# Patient Record
Sex: Female | Born: 1965 | Race: Black or African American | Hispanic: No | Marital: Married | Smoking: Never smoker
Health system: Southern US, Community
[De-identification: ages and names within clinical notes are randomized; demographics above are authoritative.]

## PROBLEM LIST (undated history)

## (undated) DIAGNOSIS — M1990 Unspecified osteoarthritis, unspecified site: Secondary | ICD-10-CM

## (undated) DIAGNOSIS — G629 Polyneuropathy, unspecified: Secondary | ICD-10-CM

## (undated) DIAGNOSIS — E7800 Pure hypercholesterolemia, unspecified: Secondary | ICD-10-CM

## (undated) DIAGNOSIS — E119 Type 2 diabetes mellitus without complications: Secondary | ICD-10-CM

## (undated) DIAGNOSIS — M869 Osteomyelitis, unspecified: Secondary | ICD-10-CM

## (undated) DIAGNOSIS — N289 Disorder of kidney and ureter, unspecified: Secondary | ICD-10-CM

## (undated) DIAGNOSIS — C801 Malignant (primary) neoplasm, unspecified: Secondary | ICD-10-CM

## (undated) DIAGNOSIS — I10 Essential (primary) hypertension: Secondary | ICD-10-CM

## (undated) DIAGNOSIS — M329 Systemic lupus erythematosus, unspecified: Secondary | ICD-10-CM

## (undated) HISTORY — PX: ABDOMINAL HYSTERECTOMY: SHX81

## (undated) HISTORY — PX: AMPUTATION TOE: SHX6595

## (undated) HISTORY — PX: LAPAROSCOPIC GASTRIC SLEEVE RESECTION: SHX5895

---

## 2017-08-21 ENCOUNTER — Other Ambulatory Visit: Payer: Self-pay

## 2017-08-21 ENCOUNTER — Emergency Department (HOSPITAL_BASED_OUTPATIENT_CLINIC_OR_DEPARTMENT_OTHER): Payer: Medicaid Other

## 2017-08-21 ENCOUNTER — Encounter (HOSPITAL_BASED_OUTPATIENT_CLINIC_OR_DEPARTMENT_OTHER): Payer: Self-pay

## 2017-08-21 DIAGNOSIS — M79604 Pain in right leg: Secondary | ICD-10-CM | POA: Insufficient documentation

## 2017-08-21 DIAGNOSIS — I129 Hypertensive chronic kidney disease with stage 1 through stage 4 chronic kidney disease, or unspecified chronic kidney disease: Secondary | ICD-10-CM | POA: Diagnosis not present

## 2017-08-21 DIAGNOSIS — M79605 Pain in left leg: Secondary | ICD-10-CM | POA: Insufficient documentation

## 2017-08-21 DIAGNOSIS — N183 Chronic kidney disease, stage 3 (moderate): Secondary | ICD-10-CM | POA: Diagnosis not present

## 2017-08-21 DIAGNOSIS — Z79899 Other long term (current) drug therapy: Secondary | ICD-10-CM | POA: Diagnosis not present

## 2017-08-21 DIAGNOSIS — E1122 Type 2 diabetes mellitus with diabetic chronic kidney disease: Secondary | ICD-10-CM | POA: Insufficient documentation

## 2017-08-21 DIAGNOSIS — Z859 Personal history of malignant neoplasm, unspecified: Secondary | ICD-10-CM | POA: Diagnosis not present

## 2017-08-21 DIAGNOSIS — Z794 Long term (current) use of insulin: Secondary | ICD-10-CM | POA: Diagnosis not present

## 2017-08-21 DIAGNOSIS — W010XXA Fall on same level from slipping, tripping and stumbling without subsequent striking against object, initial encounter: Secondary | ICD-10-CM | POA: Insufficient documentation

## 2017-08-21 NOTE — ED Triage Notes (Signed)
Pt reports she slipped and fell last PM. Pt c/o pain today in lower back, L hip, L knee, and bilateral ankles. Pt states she hit her head on the ground but denies and LOC.

## 2017-08-22 ENCOUNTER — Emergency Department (HOSPITAL_BASED_OUTPATIENT_CLINIC_OR_DEPARTMENT_OTHER)
Admission: EM | Admit: 2017-08-22 | Discharge: 2017-08-22 | Discharge status: Against Medical Advice | Payer: Medicaid Other | Attending: Emergency Medicine | Admitting: Emergency Medicine

## 2017-08-22 DIAGNOSIS — M79604 Pain in right leg: Secondary | ICD-10-CM

## 2017-08-22 DIAGNOSIS — M79605 Pain in left leg: Secondary | ICD-10-CM

## 2017-08-22 DIAGNOSIS — W19XXXA Unspecified fall, initial encounter: Secondary | ICD-10-CM

## 2017-08-22 HISTORY — DX: Osteomyelitis, unspecified: M86.9

## 2017-08-22 HISTORY — DX: Unspecified osteoarthritis, unspecified site: M19.90

## 2017-08-22 HISTORY — DX: Essential (primary) hypertension: I10

## 2017-08-22 HISTORY — DX: Pure hypercholesterolemia, unspecified: E78.00

## 2017-08-22 HISTORY — DX: Malignant (primary) neoplasm, unspecified: C80.1

## 2017-08-22 HISTORY — DX: Polyneuropathy, unspecified: G62.9

## 2017-08-22 HISTORY — DX: Disorder of kidney and ureter, unspecified: N28.9

## 2017-08-22 HISTORY — DX: Systemic lupus erythematosus, unspecified: M32.9

## 2017-08-22 HISTORY — DX: Type 2 diabetes mellitus without complications: E11.9

## 2017-08-22 MED ORDER — KETOROLAC TROMETHAMINE 15 MG/ML IJ SOLN
15.0000 mg | Freq: Once | INTRAMUSCULAR | Status: AC
  Administered 2017-08-22: 15 mg via INTRAMUSCULAR
  Filled 2017-08-22: qty 1

## 2017-08-22 MED ORDER — KETOROLAC TROMETHAMINE 15 MG/ML IJ SOLN: 15.0000 mg | Freq: Once | INTRAMUSCULAR | Status: DC

## 2017-08-22 MED ORDER — ACETAMINOPHEN 500 MG PO TABS
1000.0000 mg | ORAL_TABLET | Freq: Once | ORAL | Status: AC
Start: 2017-08-22 — End: 2017-08-22
  Administered 2017-08-22: 1000 mg via ORAL
  Filled 2017-08-22: qty 2

## 2017-08-22 MED ORDER — DIAZEPAM 2 MG PO TABS
2.0000 mg | ORAL_TABLET | Freq: Once | ORAL | Status: AC
  Administered 2017-08-22: 2 mg via ORAL
  Filled 2017-08-22: qty 1

## 2017-08-22 NOTE — ED Notes (Signed)
Pt family to the registration desk to notify staff they are still here in canteen area.

## 2017-08-22 NOTE — ED Provider Notes (Signed)
[facility] EMERGENCY DEPARTMENT Provider Note   CSN: [id number] Arrival date & time: 08/21/17  2134     History   Chief Complaint Chief Complaint  Patient presents with  . Fall    HPI Courtney Salazar is a 52 y.o. female.  42 yoF with a chief complaint of a fall from standing.  The patient slipped on a wet floor while she was at a restaurant.  She landed with her legs in a split.  Complaining of pain throughout bilateral lower extremities.  She is unable to localize any area of worst pain.  She told me that she essentially hurts all over since she fell.  She has been ambulatory.  Denies chest pain abdominal pain head injury or loss consciousness.  Denies back pain.   The history is provided by the patient.  Fall  This is a new problem. The current episode started 6 to 12 hours ago. The problem occurs rarely. The problem has been resolved. Pertinent negatives include no chest pain, no headaches and no shortness of breath. The symptoms are aggravated by walking, bending and twisting. Nothing relieves the symptoms. She has tried nothing for the symptoms. The treatment provided no relief.    Past Medical History:  Diagnosis Date  . Arthritis   . Cancer ([place])    unknown  . Diabetes mellitus without complication ([place])   . High cholesterol   . Hypertension   . Lupus   . Neuropathy   . Osteomyelitis ([place])   . Renal disorder    CKD Stage 3    There are no active problems to display for this patient.   Past Surgical History:  Procedure Laterality Date  . ABDOMINAL HYSTERECTOMY    . AMPUTATION TOE Right    5th  . LAPAROSCOPIC GASTRIC SLEEVE RESECTION      OB History    No data available       Home Medications    Prior to Admission medications   Medication Sig Start Date End Date Taking? Authorizing Provider  amLODipine (NORVASC) 5 MG tablet Take 5 mg by mouth daily.   Yes [provider]  atorvastatin (LIPITOR) 40 MG tablet Take 40 mg by mouth  daily.   Yes [provider]  calcium carbonate 1250 MG capsule Take 1,250 mg by mouth 2 (two) times daily with a meal.   Yes [provider]  diclofenac sodium (VOLTAREN) 1 % GEL Apply topically 4 (four) times daily.   Yes [provider]  ergocalciferol (VITAMIN D2) 50000 units capsule Take 50,000 Units by mouth once a week.   Yes [provider]  gabapentin (NEURONTIN) 300 MG capsule Take 300 mg by mouth 3 (three) times daily.   Yes [provider]  insulin detemir (LEVEMIR) 100 UNIT/ML injection Inject into the skin at bedtime.   Yes [provider]  losartan-hydrochlorothiazide (HYZAAR) 100-25 MG tablet Take 1 tablet by mouth daily.   Yes [provider]  metFORMIN (GLUCOPHAGE) 500 MG tablet Take by mouth 2 (two) times daily with a meal.   Yes [provider]  omeprazole (PRILOSEC) 20 MG capsule Take 20 mg by mouth daily.   Yes [provider]  pregabalin (LYRICA) 100 MG capsule Take 100 mg by mouth 2 (two) times daily.   Yes [provider]  promethazine (PHENERGAN) 25 MG tablet Take 25 mg by mouth every 6 (six) hours as needed for nausea or vomiting.   Yes [provider]  venlafaxine [facility])  37.5 MG tablet Take 37.5 mg by mouth 2 (two) times daily.   Yes [provider]    Family History No family history on file.  Social History Social History   Tobacco Use  . Smoking status: Never Smoker  . Smokeless tobacco: Never Used  Substance Use Topics  . Alcohol use: No    Frequency: Never  . Drug use: No     Allergies   Morphine and related   Review of Systems Review of Systems  Constitutional: Negative for chills and fever.  HENT: Negative for congestion and rhinorrhea.   Eyes: Negative for redness and visual disturbance.  Respiratory: Negative for shortness of breath and wheezing.   Cardiovascular: Negative for chest pain and palpitations.  Gastrointestinal:  Negative for nausea and vomiting.  Genitourinary: Negative for dysuria and urgency.  Musculoskeletal: Positive for arthralgias and gait problem. Negative for myalgias.  Skin: Negative for pallor and wound.  Neurological: Negative for dizziness and headaches.     Physical Exam Updated Vital Signs BP (!) 155/70 (BP Location: Right Arm)   Pulse 84   Temp 98.2 F (36.8 C) (Oral)   Resp 18   Ht 5\' 6"  (1.676 m)   Wt 92.1 kg (203 lb)   SpO2 98%   BMI 32.77 kg/m   Physical Exam  Constitutional: She is oriented to person, place, and time. She appears well-developed and well-nourished. No distress.  HENT:  Head: Normocephalic and atraumatic.  Eyes: EOM are normal. Pupils are equal, round, and reactive to light.  Neck: Normal range of motion. Neck supple.  Cardiovascular: Normal rate and regular rhythm. Exam reveals no gallop and no friction rub.  No murmur heard. Pulmonary/Chest: Effort normal. She has no wheezes. She has no rales.  Abdominal: Soft. She exhibits no distension. There is no tenderness.  Musculoskeletal: She exhibits no edema or tenderness.  The patient complains of severe pain no matter where I touch her to bilateral lower extremities.  She states that the worst is to the left greater trochanter.  Pulse and motor intact to the left lower extremity.  She denies ability to feel with her feet due to diabetes.  Neurological: She is alert and oriented to person, place, and time.  Skin: Skin is warm and dry. She is not diaphoretic.  Psychiatric: She has a normal mood and affect. Her behavior is normal.  Nursing note and vitals reviewed.    ED Treatments / Results  Labs (all labs ordered are listed, but only abnormal results are displayed) Labs Reviewed - No data to display  EKG  EKG Interpretation None       Radiology Dg Ankle Complete Left  Result Date: 08/21/2017 CLINICAL DATA:  Pain after fall last evening. EXAM: LEFT ANKLE COMPLETE - 3+ VIEW COMPARISON:   None. FINDINGS: There is no evidence of fracture, dislocation, or joint effusion. Periosteal new bone formation and calcification of the distal interosseous membrane likely reflects stigmata from old remote trauma. Calcaneal enthesopathy is noted along the plantar aspect. Soft tissues are unremarkable. IMPRESSION: 1. Stigmata of old remote trauma along the expected location of the interosseous membrane. 2. No acute fracture, malalignment or joint effusion. 3. Plantar calcaneal enthesophyte. Electronically Signed   By: Ashley Royalty M.D.   On: 08/21/2017 22:41   Dg Ankle Complete Right  Result Date: 08/21/2017 CLINICAL DATA:  Pain after fall last evening EXAM: RIGHT ANKLE - COMPLETE 3+ VIEW COMPARISON:  None. FINDINGS: Small subchondral rounded lucency with sclerotic margins may reflect  an osteochondral defect/osteochondritis dissecans of the lateral talar dome versus degenerative subchondral cyst. No joint effusion, acute fracture malalignment is identified. Mild soft tissue swelling about the malleoli is noted. Plantar calcaneal enthesophyte is seen. IMPRESSION: 1. No acute fracture.  Mild soft tissue swelling about the malleoli. 2. 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 Electronically Signed   By: Ashley Royalty M.D.   On: 08/21/2017 22:43   Dg Knee Complete 4 Views Left  Result Date: 08/21/2017 CLINICAL DATA:  Lateral knee pain after fall last evening. Pain with weight-bearing. EXAM: LEFT KNEE - COMPLETE 4+ VIEW COMPARISON:  None. FINDINGS: No acute fracture or malalignment of the left knee. Tiny suprapatellar joint effusion. No intra-articular loose body. No significant soft tissue swelling. IMPRESSION: No acute osseous abnormality of the left knee.  Tiny joint effusion. Electronically Signed   By: Ashley Royalty M.D.   On: 08/21/2017 22:39   Dg Hip Unilat W Or Wo Pelvis 2-3 Views Left  Result Date: 08/21/2017 CLINICAL DATA:  Left hip pain after  fall last evening. EXAM: DG HIP (WITH OR WITHOUT PELVIS) 2-3V LEFT COMPARISON:  None. FINDINGS: There is no evidence of hip fracture or dislocation. Tiny left os acetabulum off the acetabular roof. There is no evidence of arthropathy or other focal bone abnormality. IMPRESSION: No acute osseous abnormality. Electronically Signed   By: Ashley Royalty M.D.   On: 08/21/2017 22:45    Procedures Procedures (including critical care time)  Medications Ordered in ED Medications  acetaminophen (TYLENOL) tablet 1,000 mg (1,000 mg Oral Given 08/22/17 0236)  diazepam (VALIUM) tablet 2 mg (2 mg Oral Given 08/22/17 0235)  ketorolac (TORADOL) 15 MG/ML injection 15 mg (15 mg Intramuscular Given 08/22/17 0236)     Initial Impression / Assessment and Plan / ED Course  I have reviewed the triage vital signs and the nursing notes.  Pertinent labs & imaging results that were available during my care of the patient were reviewed by me and considered in my medical decision making (see chart for details).     52 yo F with a chief complaint of a fall from standing.  The patient has no signs of trauma but is planing of severe tenderness on exam to everywhere that I touch.  Imaging is negative for fracture.  Discharge home.  2:39 AM:  I have discussed the diagnosis/risks/treatment options with the patient and family and believe the pt to be eligible for discharge home to follow-up with PCP. We also discussed returning to the ED immediately if new or worsening sx occur. We discussed the sx which are most concerning (e.g., sudden worsening pain, fever, inability to tolerate by mouth) that necessitate immediate return. Medications administered to the patient during their visit and any new prescriptions provided to the patient are listed below.  Medications given during this visit Medications  acetaminophen (TYLENOL) tablet 1,000 mg (1,000 mg Oral Given 08/22/17 0236)  diazepam (VALIUM) tablet 2 mg (2 mg Oral Given 08/22/17  0235)  ketorolac (TORADOL) 15 MG/ML injection 15 mg (15 mg Intramuscular Given 08/22/17 0236)     The patient appears reasonably screen and/or stabilized for discharge and I doubt any other medical condition or other [facility] requiring further screening, evaluation, or treatment in the ED at this time prior to discharge.    Final Clinical Impressions(s) / ED Diagnoses   Final diagnoses:  Fall, initial encounter  Pain in both lower extremities    ED Discharge Orders  None       Deno Etienne, DO 08/22/17 [phone]

## 2017-08-22 NOTE — Discharge Instructions (Signed)
Take 4 over the counter ibuprofen tablets 3 times a day or 2 over-the-counter naproxen tablets twice a day for pain. Also take tylenol 1000mg(2 extra strength) four times a day.    

## 2017-08-22 NOTE — ED Notes (Signed)
Pt called x3 with no response. Disposition set to [facility].

## 2018-12-04 ENCOUNTER — Emergency Department (HOSPITAL_COMMUNITY): Payer: Medicaid Other

## 2018-12-04 ENCOUNTER — Other Ambulatory Visit: Payer: Self-pay

## 2018-12-04 ENCOUNTER — Emergency Department (HOSPITAL_COMMUNITY)
Admission: EM | Admit: 2018-12-04 | Discharge: 2018-12-04 | Disposition: A | Discharge status: Home / Self Care | Payer: Medicaid Other | Attending: Emergency Medicine | Admitting: Emergency Medicine

## 2018-12-04 ENCOUNTER — Encounter (HOSPITAL_COMMUNITY): Payer: Self-pay

## 2018-12-04 DIAGNOSIS — Z79899 Other long term (current) drug therapy: Secondary | ICD-10-CM | POA: Insufficient documentation

## 2018-12-04 DIAGNOSIS — N183 Chronic kidney disease, stage 3 (moderate): Secondary | ICD-10-CM | POA: Insufficient documentation

## 2018-12-04 DIAGNOSIS — R531 Weakness: Secondary | ICD-10-CM | POA: Diagnosis not present

## 2018-12-04 DIAGNOSIS — I129 Hypertensive chronic kidney disease with stage 1 through stage 4 chronic kidney disease, or unspecified chronic kidney disease: Secondary | ICD-10-CM | POA: Insufficient documentation

## 2018-12-04 DIAGNOSIS — R519 Headache, unspecified: Secondary | ICD-10-CM

## 2018-12-04 DIAGNOSIS — Z794 Long term (current) use of insulin: Secondary | ICD-10-CM | POA: Diagnosis not present

## 2018-12-04 DIAGNOSIS — R51 Headache: Secondary | ICD-10-CM | POA: Diagnosis present

## 2018-12-04 DIAGNOSIS — E1122 Type 2 diabetes mellitus with diabetic chronic kidney disease: Secondary | ICD-10-CM | POA: Insufficient documentation

## 2018-12-04 DIAGNOSIS — E1140 Type 2 diabetes mellitus with diabetic neuropathy, unspecified: Secondary | ICD-10-CM | POA: Insufficient documentation

## 2018-12-04 LAB — COMPREHENSIVE METABOLIC PANEL
ALT: 19 U/L (ref 0–44)
AST: 28 U/L (ref 15–41)
Albumin: 3.3 g/dL — ABNORMAL LOW (ref 3.5–5.0)
Alkaline Phosphatase: 79 U/L (ref 38–126)
Anion gap: 9 (ref 5–15)
BUN: 24 mg/dL — ABNORMAL HIGH (ref 6–20)
CO2: 19 mmol/L — ABNORMAL LOW (ref 22–32)
Calcium: 9.3 mg/dL (ref 8.9–10.3)
Chloride: 107 mmol/L (ref 98–111)
Creatinine, Ser: 1.42 mg/dL — ABNORMAL HIGH (ref 0.44–1.00)
GFR calc Af Amer: 48 mL/min — ABNORMAL LOW (ref >60)
GFR calc non Af Amer: 42 mL/min — ABNORMAL LOW (ref >60)
Glucose, Bld: 379 mg/dL — ABNORMAL HIGH (ref 70–99)
Potassium: 4.3 mmol/L (ref 3.5–5.1)
Sodium: 135 mmol/L (ref 135–145)
Total Bilirubin: 0.6 mg/dL (ref 0.3–1.2)
Total Protein: 7.1 g/dL (ref 6.5–8.1)

## 2018-12-04 LAB — I-STAT CHEM 8, ED
BUN: 25 mg/dL — ABNORMAL HIGH (ref 6–20)
Calcium, Ion: 1.28 mmol/L (ref 1.15–1.40)
Chloride: 110 mmol/L (ref 98–111)
Creatinine, Ser: 1.3 mg/dL — ABNORMAL HIGH (ref 0.44–1.00)
Glucose, Bld: 378 mg/dL — ABNORMAL HIGH (ref 70–99)
HCT: 35 % — ABNORMAL LOW (ref 36.0–46.0)
Hemoglobin: 11.9 g/dL — ABNORMAL LOW (ref 12.0–15.0)
Potassium: 4.3 mmol/L (ref 3.5–5.1)
Sodium: 139 mmol/L (ref 135–145)
TCO2: 22 mmol/L (ref 22–32)

## 2018-12-04 LAB — DIFFERENTIAL
Abs Immature Granulocytes: 0.01 10*3/uL (ref 0.00–0.07)
Basophils Absolute: 0 10*3/uL (ref 0.0–0.1)
Basophils Relative: 1 %
Eosinophils Absolute: 0.2 10*3/uL (ref 0.0–0.5)
Eosinophils Relative: 2 %
Immature Granulocytes: 0 %
Lymphocytes Relative: 44 %
Lymphs Abs: 3.2 10*3/uL (ref 0.7–4.0)
Monocytes Absolute: 0.5 10*3/uL (ref 0.1–1.0)
Monocytes Relative: 8 %
Neutro Abs: 3.1 10*3/uL (ref 1.7–7.7)
Neutrophils Relative %: 45 %

## 2018-12-04 LAB — I-STAT BETA HCG BLOOD, ED (MC, WL, AP ONLY): I-stat hCG, quantitative: <5 m[IU]/mL (ref <5)

## 2018-12-04 LAB — CBC
HCT: 34.7 % — ABNORMAL LOW (ref 36.0–46.0)
Hemoglobin: 10.7 g/dL — ABNORMAL LOW (ref 12.0–15.0)
MCH: 27.9 pg (ref 26.0–34.0)
MCHC: 30.8 g/dL (ref 30.0–36.0)
MCV: 90.6 fL (ref 80.0–100.0)
Platelets: 177 10*3/uL (ref 150–400)
RBC: 3.83 MIL/uL — ABNORMAL LOW (ref 3.87–5.11)
RDW: 13.6 % (ref 11.5–15.5)
WBC: 7 10*3/uL (ref 4.0–10.5)
nRBC: 0 % (ref 0.0–0.2)

## 2018-12-04 LAB — PROTIME-INR
INR: 0.9 (ref 0.8–1.2)
Prothrombin Time: 12.5 seconds (ref 11.4–15.2)

## 2018-12-04 LAB — APTT: aPTT: 40 seconds — ABNORMAL HIGH (ref 24–36)

## 2018-12-04 MED ORDER — SODIUM CHLORIDE 0.9% FLUSH
3.0000 mL | Freq: Once | INTRAVENOUS | Status: AC
  Administered 2018-12-04: 3 mL via INTRAVENOUS

## 2018-12-04 MED ORDER — MAGNESIUM SULFATE 2 GM/50ML IV SOLN
2.0000 g | Freq: Once | INTRAVENOUS | Status: AC
  Administered 2018-12-04: 2 g via INTRAVENOUS
  Filled 2018-12-04: qty 50

## 2018-12-04 MED ORDER — DIPHENHYDRAMINE HCL 50 MG/ML IJ SOLN
12.5000 mg | Freq: Once | INTRAMUSCULAR | Status: AC
  Administered 2018-12-04: 12.5 mg via INTRAVENOUS
  Filled 2018-12-04: qty 1

## 2018-12-04 MED ORDER — SODIUM CHLORIDE 0.9 % IV BOLUS
1000.0000 mL | Freq: Once | INTRAVENOUS | Status: AC
  Administered 2018-12-04: 1000 mL via INTRAVENOUS

## 2018-12-04 MED ORDER — DIVALPROEX SODIUM 250 MG PO DR TAB
500.0000 mg | DELAYED_RELEASE_TABLET | Freq: Once | ORAL | Status: AC
  Administered 2018-12-04: 04:00:00 500 mg via ORAL
  Filled 2018-12-04: qty 2

## 2018-12-04 MED ORDER — IOHEXOL 350 MG/ML SOLN
75.0000 mL | Freq: Once | INTRAVENOUS | Status: AC | PRN
  Administered 2018-12-04: 75 mL via INTRAVENOUS

## 2018-12-04 MED ORDER — ACETAMINOPHEN 500 MG PO TABS
1000.0000 mg | ORAL_TABLET | Freq: Once | ORAL | Status: AC
  Administered 2018-12-04: 04:00:00 1000 mg via ORAL
  Filled 2018-12-04: qty 2

## 2018-12-04 MED ORDER — METOCLOPRAMIDE HCL 5 MG/ML IJ SOLN
10.0000 mg | Freq: Once | INTRAMUSCULAR | Status: AC
  Administered 2018-12-04: 10 mg via INTRAVENOUS
  Filled 2018-12-04: qty 2

## 2018-12-04 NOTE — ED Triage Notes (Signed)
Pt states LSN 8pm since has had L sided weakness, unsteady gait and worst headache of her life.

## 2018-12-04 NOTE — ED Notes (Signed)
Patient transported to MRI 

## 2018-12-04 NOTE — Consult Note (Signed)
Neurology Consultation Reason for Consult: Left-sided weakness Referring Physician: [place], a  CC: Left-sided weakness  History is obtained from: Patient  HPI: Courtney Salazar is a 53 y.o. female with a history of hypertension, high cholesterol, lupus who presents with left-sided weakness that started about 8 PM.  She states that she noticed some flashing lights in her vision, and subsequently had a headache on the left side of her head that was retro-orbital and then numbness spread down the left side of her body.  Since that time, she has had numbness and weakness on the left side very degree, and finally sought care in the emergency department tonight.  At the triage, there was concern that she may have some difficulty with speech as well and therefore a code stroke was activated.  She denies any history of migraines or headaches either now or in the past.  LKW: 8 PM tpa given?: no, out of window   ROS: A 14 point ROS was performed and is negative except as noted in the HPI.  Past Medical History:  Diagnosis Date  . Arthritis   . Cancer ([place])    unknown  . Diabetes mellitus without complication ([place])   . High cholesterol   . Hypertension   . Lupus ([place])   . Neuropathy   . Osteomyelitis ([place])   . Renal disorder    CKD Stage 3     Family history: Multiple family members with stroke including her brother who is currently admitted with a stroke   Social History:  reports that she has never smoked. She has never used smokeless tobacco. She reports that she does not drink alcohol or use drugs.   Exam: Current vital signs: BP 127/69   Pulse 72   Temp 98.2 F (36.8 C) (Oral)   Resp 15   Ht 5' 6.75" (1.695 m)   Wt 108.9 kg   SpO2 97%   BMI 37.87 kg/m  Vital signs in last 24 hours: Temp:  [98.2 F (36.8 C)] 98.2 F (36.8 C) (06/23 0100) Pulse Rate:  [72-89] 72 (06/23 0230) Resp:  [15] 15 (06/23 0230) BP: (127-164)/(69-78) 127/69 (06/23 0230) SpO2:  [97 %-100 %] 97 %  (06/23 0230) Weight:  [108.9 kg] 108.9 kg (06/23 0156)   Physical Exam  Constitutional: Appears well-developed and well-nourished.  Psych: Affect appropriate to situation Eyes: No scleral injection HENT: No OP obstrucion Head: Normocephalic.  Cardiovascular: Normal rate and regular rhythm.  Respiratory: Effort normal, non-labored breathing GI: Soft.  No distension. There is no tenderness.  Skin: WDI  Neuro: Mental Status: Patient is awake, alert, oriented to person, place, month, year, and situation. Patient is able to give a clear and coherent history. No signs of aphasia or neglect Cranial Nerves: II: Visual Fields are full. Pupils are equal, round, and reactive to light.   III,IV, VI: EOMI without ptosis or diploplia.  V: Facial sensation is diminished on the left and she splits midline to pinprick VII: Facial movement is symmetric.  VIII: hearing is intact to voice X: Uvula elevates symmetrically XI: Shoulder shrug is symmetric. XII: tongue is midline without atrophy or fasciculations.  Motor: She has mild, slightly inconsistent left-sided weakness  sensory: Sensation is diminished in the left leg, no extinction Cerebellar: FNF and HKS are intact on the right, consistent with weakness in the left      I have reviewed labs in epic and the results pertinent to this consultation are: Creatinine 1.3  I have reviewed the images  obtained: CT/CTA- no evidence of dissection or venous sinus thrombosis  Impression: 53 year old female with headache, positive visual symptoms, left-sided numbness/weakness.  This combination of symptoms does raise the possibility of complicated migraine, however this putting midline could be suggestive that there may be a psychogenic component as well.  Without a history of migraine, she will need an MRI to exclude ischemic stroke given that she does have risk factors.  If this is negative, then I would not pursue further  testing.  Recommendations: 1) MRI brain 2) if negative would treat as complicated migraine   Roland Rack, MD [facility] [phone]  If 7pm- 7am, please page neurology on call as listed in [place].

## 2018-12-04 NOTE — ED Provider Notes (Signed)
[facility]ERGENCY DEPARTMENT Provider Note   CSN: [id number] Arrival date & time: 12/04/18  4098  An emergency department physician performed an initial assessment on this suspected stroke patient at [place].  History   Chief Complaint Chief Complaint  Patient presents with   Code Stroke    HPI Courtney Salazar is a 53 y.o. female.     The history is provided by the patient.  Headache Pain location:  L temporal Quality:  Dull Radiates to:  Does not radiate Severity currently:  10/10 Severity at highest:  10/10 Onset quality:  Gradual Duration:  1 week Timing:  Constant Progression:  Unchanged Chronicity:  New Context: emotional stress   Context: not activity, not exposure to bright light, not caffeine, not coughing, not defecating, not eating, not exposure to cold air, not intercourse, not loud noise and not straining   Relieved by:  Nothing Worsened by:  Nothing Ineffective treatments:  None tried Associated symptoms: no abdominal pain, no back pain, no blurred vision, no congestion, no cough, no diarrhea, no dizziness, no ear pain, no eye pain, no facial pain, no fatigue, no fever, no hearing loss, no loss of balance, no myalgias, no nausea, no near-syncope, no neck pain, no neck stiffness, no paresthesias, no photophobia, no seizures, no tingling, no URI, no visual change and no vomiting   Associated symptoms comment:  Left side weakness Risk factors: no anger   Brother in hospital with stroke   Past Medical History:  Diagnosis Date   Arthritis    Cancer ([place])    unknown   Diabetes mellitus without complication ([place])    High cholesterol    Hypertension    Lupus ([place])    Neuropathy    Osteomyelitis ([facility])    Renal disorder    CKD Stage 3    There are no active problems to display for this patient.   Past Surgical History:  Procedure Laterality Date   ABDOMINAL HYSTERECTOMY     AMPUTATION TOE Right    5th   LAPAROSCOPIC  GASTRIC SLEEVE RESECTION       OB History   No obstetric history on file.      Home Medications    Prior to Admission medications   Medication Sig Start Date End Date Taking? Authorizing Provider  amLODipine (NORVASC) 5 MG tablet Take 5 mg by mouth daily.    [provider]  atorvastatin (LIPITOR) 40 MG tablet Take 40 mg by mouth daily.    [provider]  calcium carbonate 1250 MG capsule Take 1,250 mg by mouth 2 (two) times daily with a meal.    [provider]  diclofenac sodium (VOLTAREN) 1 % GEL Apply topically 4 (four) times daily.    [provider]  ergocalciferol (VITAMIN D2) 50000 units capsule Take 50,000 Units by mouth once a week.    [provider]  gabapentin (NEURONTIN) 300 MG capsule Take 300 mg by mouth 3 (three) times daily.    [provider]  insulin detemir (LEVEMIR) 100 UNIT/ML injection Inject into the skin at bedtime.    [provider]  losartan-hydrochlorothiazide (HYZAAR) 100-25 MG tablet Take 1 tablet by mouth daily.    [provider]  metFORMIN (GLUCOPHAGE) 500 MG tablet Take by mouth 2 (two) times daily with a meal.    [provider]  omeprazole (PRILOSEC) 20 MG capsule Take 20 mg by mouth daily.    [provider]  pregabalin (LYRICA) 100 MG capsule  Take 100 mg by mouth 2 (two) times daily.    [provider]  promethazine (PHENERGAN) 25 MG tablet Take 25 mg by mouth every 6 (six) hours as needed for nausea or vomiting.    [provider]  venlafaxine (EFFEXOR) 37.5 MG tablet Take 37.5 mg by mouth 2 (two) times daily.    [provider]    Family History No family history on file.  Social History Social History   Tobacco Use   Smoking status: Never Smoker   Smokeless tobacco: Never Used  Substance Use Topics   Alcohol use: No    Frequency: Never   Drug use: No     Allergies   Morphine and related   Review of  Systems Review of Systems  Constitutional: Negative for fatigue and fever.  HENT: Negative for congestion, ear pain and hearing loss.   Eyes: Negative for blurred vision, photophobia and pain.  Respiratory: Negative for cough.   Cardiovascular: Negative for near-syncope.  Gastrointestinal: Negative for abdominal pain, diarrhea, nausea and vomiting.  Musculoskeletal: Negative for back pain, myalgias, neck pain and neck stiffness.  Neurological: Positive for headaches. Negative for dizziness, seizures, paresthesias and loss of balance.     Physical Exam Updated Vital Signs BP (!) 143/75 (BP Location: Right Arm)    Pulse 61    Temp 98.2 F (36.8 C) (Oral)    Resp 15    Ht 5' 6.75" (1.695 m)    Wt 108.9 kg    SpO2 99%    BMI 37.87 kg/m   Physical Exam   ED Treatments / Results  Labs (all labs ordered are listed, but only abnormal results are displayed) Labs Reviewed  APTT - Abnormal; Notable for the following components:      Result Value   aPTT 40 (*)    All other components within normal limits  CBC - Abnormal; Notable for the following components:   RBC 3.83 (*)    Hemoglobin 10.7 (*)    HCT 34.7 (*)    All other components within normal limits  COMPREHENSIVE METABOLIC PANEL - Abnormal; Notable for the following components:   CO2 19 (*)    Glucose, Bld 379 (*)    BUN 24 (*)    Creatinine, Ser 1.42 (*)    Albumin 3.3 (*)    GFR calc non Af Amer 42 (*)    GFR calc Af Amer 48 (*)    All other components within normal limits  I-STAT CHEM 8, ED - Abnormal; Notable for the following components:   BUN 25 (*)    Creatinine, Ser 1.30 (*)    Glucose, Bld 378 (*)    Hemoglobin 11.9 (*)    HCT 35.0 (*)    All other components within normal limits  PROTIME-INR  DIFFERENTIAL  CBG MONITORING, ED  I-STAT BETA HCG BLOOD, ED ([facility], [facility], [facility] ONLY)    EKG    Radiology Ct Angio Head W Or Wo Contrast  Result Date: 12/04/2018 CLINICAL DATA:  Left-sided weakness and headache EXAM:  CT ANGIOGRAPHY HEAD AND NECK TECHNIQUE: Multidetector CT imaging of the head and neck was performed using the standard protocol during bolus administration of intravenous contrast. Multiplanar CT image reconstructions and MIPs were obtained to evaluate the vascular anatomy. Carotid stenosis measurements (when applicable) are obtained utilizing NASCET criteria, using the distal internal carotid diameter as the denominator. CONTRAST:  39mL OMNIPAQUE IOHEXOL 350 MG/ML SOLN COMPARISON:  Head CT 12/04/2018 FINDINGS: CTA NECK FINDINGS SKELETON:  There is no bony spinal canal stenosis. No lytic or blastic lesion. OTHER NECK: Normal pharynx, larynx and major salivary glands. No cervical lymphadenopathy. Unremarkable thyroid gland. UPPER CHEST: No pneumothorax or pleural effusion. No nodules or masses. AORTIC ARCH: There is no calcific atherosclerosis of the aortic arch. There is no aneurysm, dissection or hemodynamically significant stenosis of the visualized ascending aorta and aortic arch. Aberrant right subclavian artery. The visualized proximal subclavian arteries are widely patent. RIGHT CAROTID SYSTEM: --Common carotid artery: Widely patent origin without common carotid artery dissection or aneurysm. --Internal carotid artery: Normal without aneurysm, dissection or stenosis. --External carotid artery: No acute abnormality. LEFT CAROTID SYSTEM: --Common carotid artery: Widely patent origin without common carotid artery dissection or aneurysm. --Internal carotid artery: Normal without aneurysm, dissection or stenosis. --External carotid artery: No acute abnormality. VERTEBRAL ARTERIES: Right dominant configuration. Both origins are clearly patent. No dissection, occlusion or flow-limiting stenosis to the skull base (V1-V3 segments). CTA HEAD FINDINGS POSTERIOR CIRCULATION: --Vertebral arteries: Normal V4 segments. --Posterior inferior cerebellar arteries (PICA): Patent origins from the vertebral arteries. --Anterior  inferior cerebellar arteries (AICA): Patent origins from the basilar artery. --Basilar artery: Normal. --Superior cerebellar arteries: Normal. --Posterior cerebral arteries (PCA): Moderate stenosis of the distal P2 segment PCA. Normal left. There are bilateral posterior communicating arteries (p-comm) that partially supply the PCAs. ANTERIOR CIRCULATION: --Intracranial internal carotid arteries: Normal. --Anterior cerebral arteries (ACA): Normal. Both A1 segments are present. Patent anterior communicating artery (a-comm). --Middle cerebral arteries (MCA): Normal. VENOUS SINUSES: As permitted by contrast timing, patent. ANATOMIC VARIANTS: None Normal delayed phase. Review of the MIP images confirms the above findings. IMPRESSION: Moderate stenosis distal right PCA P2 segment. Otherwise normal CTA of the head and neck. Electronically Signed   By: Ulyses Jarred M.D.   On: 12/04/2018 01:53   Ct Angio Neck W Or Wo Contrast  Result Date: 12/04/2018 CLINICAL DATA:  Left-sided weakness and headache EXAM: CT ANGIOGRAPHY HEAD AND NECK TECHNIQUE: Multidetector CT imaging of the head and neck was performed using the standard protocol during bolus administration of intravenous contrast. Multiplanar CT image reconstructions and MIPs were obtained to evaluate the vascular anatomy. Carotid stenosis measurements (when applicable) are obtained utilizing NASCET criteria, using the distal internal carotid diameter as the denominator. CONTRAST:  25mL OMNIPAQUE IOHEXOL 350 MG/ML SOLN COMPARISON:  Head CT 12/04/2018 FINDINGS: CTA NECK FINDINGS SKELETON: There is no bony spinal canal stenosis. No lytic or blastic lesion. OTHER NECK: Normal pharynx, larynx and major salivary glands. No cervical lymphadenopathy. Unremarkable thyroid gland. UPPER CHEST: No pneumothorax or pleural effusion. No nodules or masses. AORTIC ARCH: There is no calcific atherosclerosis of the aortic arch. There is no aneurysm, dissection or hemodynamically  significant stenosis of the visualized ascending aorta and aortic arch. Aberrant right subclavian artery. The visualized proximal subclavian arteries are widely patent. RIGHT CAROTID SYSTEM: --Common carotid artery: Widely patent origin without common carotid artery dissection or aneurysm. --Internal carotid artery: Normal without aneurysm, dissection or stenosis. --External carotid artery: No acute abnormality. LEFT CAROTID SYSTEM: --Common carotid artery: Widely patent origin without common carotid artery dissection or aneurysm. --Internal carotid artery: Normal without aneurysm, dissection or stenosis. --External carotid artery: No acute abnormality. VERTEBRAL ARTERIES: Right dominant configuration. Both origins are clearly patent. No dissection, occlusion or flow-limiting stenosis to the skull base (V1-V3 segments). CTA HEAD FINDINGS POSTERIOR CIRCULATION: --Vertebral arteries: Normal V4 segments. --Posterior inferior cerebellar arteries (PICA): Patent origins from the vertebral arteries. --Anterior inferior cerebellar arteries (AICA): Patent origins from the basilar artery. --  Basilar artery: Normal. --Superior cerebellar arteries: Normal. --Posterior cerebral arteries (PCA): Moderate stenosis of the distal P2 segment PCA. Normal left. There are bilateral posterior communicating arteries (p-comm) that partially supply the PCAs. ANTERIOR CIRCULATION: --Intracranial internal carotid arteries: Normal. --Anterior cerebral arteries (ACA): Normal. Both A1 segments are present. Patent anterior communicating artery (a-comm). --Middle cerebral arteries (MCA): Normal. VENOUS SINUSES: As permitted by contrast timing, patent. ANATOMIC VARIANTS: None Normal delayed phase. Review of the MIP images confirms the above findings. IMPRESSION: Moderate stenosis distal right PCA P2 segment. Otherwise normal CTA of the head and neck. Electronically Signed   By: Ulyses Jarred M.D.   On: 12/04/2018 01:53   Mr Brain Wo  Contrast  Result Date: 12/04/2018 CLINICAL DATA:  Left-sided weakness with headache and unsteady gait EXAM: MRI HEAD WITHOUT CONTRAST TECHNIQUE: Multiplanar, multiecho pulse sequences of the brain and surrounding structures were obtained without intravenous contrast. COMPARISON:  CTA head neck 12/04/2018 FINDINGS: BRAIN: There is no acute infarct, acute hemorrhage or extra-axial collection. The midline structures are normal. Mild white matter hyperintensity, most commonly due to chronic ischemic microangiopathy. The cerebral and cerebellar volume are age-appropriate. No hydrocephalus. Susceptibility-sensitive sequences show no chronic microhemorrhage or superficial siderosis. No midline shift or other mass effect. VASCULAR: The major intracranial arterial and venous sinus flow voids are normal. SKULL AND UPPER CERVICAL SPINE: Calvarial bone marrow signal is normal. There is no skull base mass. Visualized upper cervical spine and soft tissues are normal. SINUSES/ORBITS: No fluid levels or advanced mucosal thickening. No mastoid or middle ear effusion. The orbits are normal. IMPRESSION: Mild chronic small vessel disease without acute intracranial abnormality. Electronically Signed   By: Ulyses Jarred M.D.   On: 12/04/2018 03:29   Ct Head Code Stroke Wo Contrast  Result Date: 12/04/2018 CLINICAL DATA:  Code stroke.  Left-sided weakness and headache EXAM: CT HEAD WITHOUT CONTRAST TECHNIQUE: Contiguous axial images were obtained from the base of the skull through the vertex without intravenous contrast. COMPARISON:  None. FINDINGS: Brain: There is no mass, hemorrhage or extra-axial collection. The size and configuration of the ventricles and extra-axial CSF spaces are normal. The brain parenchyma is normal, without evidence of acute or chronic infarction. Vascular: No abnormal hyperdensity of the major intracranial arteries or dural venous sinuses. No intracranial atherosclerosis. Skull: The visualized skull base,  calvarium and extracranial soft tissues are normal. Sinuses/Orbits: No fluid levels or advanced mucosal thickening of the visualized paranasal sinuses. No mastoid or middle ear effusion. The orbits are normal. ASPECTS [facility] Stroke Program Early CT Score) - Ganglionic level infarction (caudate, lentiform nuclei, internal capsule, insula, M1-M3 cortex): 7 - Supraganglionic infarction (M4-M6 cortex): 3 Total score (0-10 with 10 being normal): 10 IMPRESSION: 1. Normal head CT 2. ASPECTS is 10 These results were communicated to Dr. Roland Rack at 1:21 am on 12/04/2018 by text page via the [facility] messaging system. Electronically Signed   By: Ulyses Jarred M.D.   On: 12/04/2018 01:22    Procedures Procedures (including critical care time)  Medications Ordered in ED Medications  acetaminophen (TYLENOL) tablet 1,000 mg (has no administration in time range)  divalproex (DEPAKOTE) DR tablet 500 mg (has no administration in time range)  sodium chloride flush (NS) 0.9 % injection 3 mL (3 mLs Intravenous Given 12/04/18 0126)  iohexol (OMNIPAQUE) 350 MG/ML injection 75 mL (75 mLs Intravenous Contrast Given 12/04/18 0128)  magnesium sulfate IVPB 2 g 50 mL (0 g Intravenous Stopped 12/04/18 0241)  metoCLOPramide (REGLAN) injection 10 mg (10 mg  Intravenous Given 12/04/18 0153)  diphenhydrAMINE (BENADRYL) injection 12.5 mg (12.5 mg Intravenous Given 12/04/18 0153)  sodium chloride 0.9 % bolus 1,000 mL (1,000 mLs Intravenous New Bag/Given 12/04/18 0338)    Improved post medication.   Initial Impression / Assessment and Plan / ED Course  Per Dr. Leonel Ramsay if MRI is negative patient may be discharged with neuro follow up   Final Clinical Impressions(s) / ED Diagnoses   Return for intractable cough, coughing up blood,fevers >100.4 unrelieved by medication, shortness of breath, intractable vomiting, chest pain, shortness of breath, weakness,numbness, changes in speech, facial asymmetry,abdominal pain,  passing out,Inability to tolerate liquids or food, cough, altered mental status or any concerns. No signs of systemic illness or infection. The patient is nontoxic-appearing on exam and vital signs are within normal limits.   I have reviewed the triage vital signs and the nursing notes. Pertinent labs &imaging results that were available during my care of the patient were reviewed by me and considered in my medical decision making (see chart for details).  After history, exam, and medical workup I feel the patient has been appropriately medically screened and is safe for discharge home. Pertinent diagnoses were discussed with the patient. Patient was given return precautions   Karris Deangelo, MD 12/04/18 0500

## 2019-01-15 MED ORDER — PHENYLEPHRINE HCL-NACL 10-0.9 MG/250ML-% IV SOLN
INTRAVENOUS | Status: AC
  Filled 2019-01-15: qty 250

## 2019-02-20 MED ORDER — PHENYLEPHRINE HCL-NACL 10-0.9 MG/250ML-% IV SOLN
INTRAVENOUS | Status: AC
  Filled 2019-02-20: qty 250

## 2019-05-22 ENCOUNTER — Emergency Department (HOSPITAL_COMMUNITY)
Admission: EM | Admit: 2019-05-22 | Discharge: 2019-05-23 | Disposition: A | Discharge status: Home / Self Care | Payer: Medicaid Other | Attending: Emergency Medicine | Admitting: Emergency Medicine

## 2019-05-22 ENCOUNTER — Other Ambulatory Visit: Payer: Self-pay

## 2019-05-22 ENCOUNTER — Emergency Department (HOSPITAL_COMMUNITY): Payer: Medicaid Other

## 2019-05-22 ENCOUNTER — Encounter (HOSPITAL_COMMUNITY): Payer: Self-pay | Admitting: Emergency Medicine

## 2019-05-22 DIAGNOSIS — R0602 Shortness of breath: Secondary | ICD-10-CM | POA: Insufficient documentation

## 2019-05-22 DIAGNOSIS — R0981 Nasal congestion: Secondary | ICD-10-CM | POA: Insufficient documentation

## 2019-05-22 DIAGNOSIS — J069 Acute upper respiratory infection, unspecified: Secondary | ICD-10-CM | POA: Diagnosis not present

## 2019-05-22 DIAGNOSIS — E1140 Type 2 diabetes mellitus with diabetic neuropathy, unspecified: Secondary | ICD-10-CM | POA: Diagnosis not present

## 2019-05-22 DIAGNOSIS — R112 Nausea with vomiting, unspecified: Secondary | ICD-10-CM | POA: Diagnosis not present

## 2019-05-22 DIAGNOSIS — Z79899 Other long term (current) drug therapy: Secondary | ICD-10-CM | POA: Diagnosis not present

## 2019-05-22 DIAGNOSIS — R05 Cough: Secondary | ICD-10-CM | POA: Diagnosis present

## 2019-05-22 DIAGNOSIS — Z20828 Contact with and (suspected) exposure to other viral communicable diseases: Secondary | ICD-10-CM | POA: Diagnosis not present

## 2019-05-22 DIAGNOSIS — Z7984 Long term (current) use of oral hypoglycemic drugs: Secondary | ICD-10-CM | POA: Diagnosis not present

## 2019-05-22 DIAGNOSIS — Z859 Personal history of malignant neoplasm, unspecified: Secondary | ICD-10-CM | POA: Insufficient documentation

## 2019-05-22 DIAGNOSIS — N183 Chronic kidney disease, stage 3 unspecified: Secondary | ICD-10-CM | POA: Diagnosis not present

## 2019-05-22 DIAGNOSIS — R0789 Other chest pain: Secondary | ICD-10-CM | POA: Insufficient documentation

## 2019-05-22 DIAGNOSIS — R197 Diarrhea, unspecified: Secondary | ICD-10-CM | POA: Diagnosis not present

## 2019-05-22 DIAGNOSIS — I129 Hypertensive chronic kidney disease with stage 1 through stage 4 chronic kidney disease, or unspecified chronic kidney disease: Secondary | ICD-10-CM | POA: Diagnosis not present

## 2019-05-22 DIAGNOSIS — E1122 Type 2 diabetes mellitus with diabetic chronic kidney disease: Secondary | ICD-10-CM | POA: Insufficient documentation

## 2019-05-22 LAB — CBC
HCT: 37 % (ref 36.0–46.0)
Hemoglobin: 11.3 g/dL — ABNORMAL LOW (ref 12.0–15.0)
MCH: 27.8 pg (ref 26.0–34.0)
MCHC: 30.5 g/dL (ref 30.0–36.0)
MCV: 90.9 fL (ref 80.0–100.0)
Platelets: 205 10*3/uL (ref 150–400)
RBC: 4.07 MIL/uL (ref 3.87–5.11)
RDW: 14.4 % (ref 11.5–15.5)
WBC: 7.7 10*3/uL (ref 4.0–10.5)
nRBC: 0 % (ref 0.0–0.2)

## 2019-05-22 LAB — CBG MONITORING, ED: Glucose-Capillary: 112 mg/dL — ABNORMAL HIGH (ref 70–99)

## 2019-05-22 MED ORDER — SODIUM CHLORIDE 0.9% FLUSH: 3.0000 mL | Freq: Once | INTRAVENOUS | Status: DC

## 2019-05-22 NOTE — ED Triage Notes (Signed)
Patient reports substernal chest pain with mild SOB and emesis , pain started Sunday , denies fever or chills , patient added elevated blood sugar this week .

## 2019-05-23 LAB — BASIC METABOLIC PANEL
Anion gap: 8 (ref 5–15)
BUN: 17 mg/dL (ref 6–20)
CO2: 22 mmol/L (ref 22–32)
Calcium: 9.6 mg/dL (ref 8.9–10.3)
Chloride: 113 mmol/L — ABNORMAL HIGH (ref 98–111)
Creatinine, Ser: 1.23 mg/dL — ABNORMAL HIGH (ref 0.44–1.00)
GFR calc Af Amer: 58 mL/min — ABNORMAL LOW (ref >60)
GFR calc non Af Amer: 50 mL/min — ABNORMAL LOW (ref >60)
Glucose, Bld: 116 mg/dL — ABNORMAL HIGH (ref 70–99)
Potassium: 3.9 mmol/L (ref 3.5–5.1)
Sodium: 143 mmol/L (ref 135–145)

## 2019-05-23 LAB — CBG MONITORING, ED
Glucose-Capillary: 63 mg/dL — ABNORMAL LOW (ref 70–99)
Glucose-Capillary: 222 mg/dL — ABNORMAL HIGH (ref 70–99)

## 2019-05-23 LAB — TROPONIN I (HIGH SENSITIVITY)
Troponin I (High Sensitivity): 10 ng/L (ref <18)
Troponin I (High Sensitivity): 12 ng/L (ref <18)

## 2019-05-23 LAB — SARS CORONAVIRUS 2 (TAT 6-24 HRS): SARS Coronavirus 2: NEGATIVE

## 2019-05-23 MED ORDER — AZITHROMYCIN 250 MG PO TABS: 250.0000 mg | ORAL_TABLET | Freq: Every day | ORAL | 0 refills | Status: DC

## 2019-05-23 MED ORDER — ALBUTEROL SULFATE HFA 108 (90 BASE) MCG/ACT IN AERS
2.0000 | INHALATION_SPRAY | Freq: Once | RESPIRATORY_TRACT | Status: AC
  Administered 2019-05-23: 2 via RESPIRATORY_TRACT
  Filled 2019-05-23: qty 6.7

## 2019-05-23 MED ORDER — BENZONATATE 100 MG PO CAPS: 100.0000 mg | ORAL_CAPSULE | Freq: Three times a day (TID) | ORAL | 0 refills | Status: AC

## 2019-05-23 NOTE — Discharge Instructions (Signed)
Use the Albuterol inhaler as needed, if needed to help control cough. You can also use Tessalon Perles as directed.   If your COVID is negative (will result in 6-24 hours), and if you start having a fever, fill the written prescription for Zithromax and take as directed.   Please return to the emergency department with any worsening symptoms or new concerns.

## 2019-05-23 NOTE — ED Provider Notes (Signed)
[facility] EMERGENCY DEPARTMENT Provider Note   CSN: [id number] Arrival date & time: 05/22/19  2305     History Chief Complaint  Patient presents with   Chest Pain    Courtney Salazar is a 53 y.o. female.  Patient with history of DM, HLD, HTN, lupus, CKD presents with symptoms including cough, chest congestion, chest tightness that started 3 days ago. She reports having a fever yesterday that resolved with Tylenol and has not recurred. She reports nausea with vomiting and diarrhea. No nausea at present. No significant nasal congestion or sore throat. She denies any known COVID positive contacts.   The history is provided by the patient. No language interpreter was used.  Chest Pain Associated symptoms: cough, fever, shortness of breath ("I feel congested") and vomiting   Associated symptoms: no abdominal pain, no headache and no weakness        Past Medical History:  Diagnosis Date   Arthritis    Cancer ([place])    unknown   Diabetes mellitus without complication ([place])    High cholesterol    Hypertension    Lupus ([place])    Neuropathy    Osteomyelitis (HCC)    Renal disorder    CKD Stage 3    There are no problems to display for this patient.   Past Surgical History:  Procedure Laterality Date   ABDOMINAL HYSTERECTOMY     AMPUTATION TOE Right    5th   LAPAROSCOPIC GASTRIC SLEEVE RESECTION       OB History   No obstetric history on file.     No family history on file.  Social History   Tobacco Use   Smoking status: Never Smoker   Smokeless tobacco: Never Used  Substance Use Topics   Alcohol use: No   Drug use: No    Home Medications Prior to Admission medications   Medication Sig Start Date End Date Taking? Authorizing Provider  amLODipine (NORVASC) 5 MG tablet Take 5 mg by mouth daily.    [provider]  atorvastatin (LIPITOR) 40 MG tablet Take 40 mg by mouth daily.    [provider]  calcium  carbonate 1250 MG capsule Take 1,250 mg by mouth 2 (two) times daily with a meal.    [provider]  diclofenac sodium (VOLTAREN) 1 % GEL Apply topically 4 (four) times daily.    [provider]  ergocalciferol (VITAMIN D2) 50000 units capsule Take 50,000 Units by mouth once a week.    [provider]  gabapentin (NEURONTIN) 300 MG capsule Take 300 mg by mouth 3 (three) times daily.    [provider]  insulin detemir (LEVEMIR) 100 UNIT/ML injection Inject into the skin at bedtime.    [provider]  losartan-hydrochlorothiazide (HYZAAR) 100-25 MG tablet Take 1 tablet by mouth daily.    [provider]  metFORMIN (GLUCOPHAGE) 500 MG tablet Take by mouth 2 (two) times daily with a meal.    [provider]  omeprazole (PRILOSEC) 20 MG capsule Take 20 mg by mouth daily.    [provider]  pregabalin (LYRICA) 100 MG capsule Take 100 mg by mouth 2 (two) times daily.    [provider]  promethazine (PHENERGAN) 25 MG tablet Take 25 mg by mouth every 6 (six) hours as needed for nausea or vomiting.    [provider]  venlafaxine (EFFEXOR) 37.5 MG tablet Take 37.5 mg by mouth 2 (two) times daily.    [provider]    Allergies    Morphine and related  Review of Systems   Review of Systems  Constitutional: Positive for fever.  HENT: Negative.  Negative for congestion and sore throat.   Respiratory: Positive for cough, chest tightness and shortness of breath ("I feel congested").   Cardiovascular: Positive for chest pain.  Gastrointestinal: Positive for diarrhea and vomiting. Negative for abdominal pain and blood in stool.  Genitourinary: Negative.  Negative for decreased urine volume and dysuria.  Musculoskeletal: Negative.  Negative for myalgias.  Skin: Negative.   Neurological: Negative.  Negative for weakness and headaches.    Physical Exam Updated Vital Signs BP (!) 153/74 (BP Location:  Right Arm)    Pulse 76    Temp 98.1 F (36.7 C) (Oral)    Resp 16    SpO2 100%   Physical Exam Vitals and nursing note reviewed.  Constitutional:      Appearance: She is well-developed.  HENT:     Head: Normocephalic.  Cardiovascular:     Rate and Rhythm: Normal rate and regular rhythm.     Heart sounds: No murmur.  Pulmonary:     Effort: Pulmonary effort is normal.     Breath sounds: Normal breath sounds. No wheezing, rhonchi or rales.     Comments: Actively coughing during exam Abdominal:     General: Bowel sounds are normal.     Palpations: Abdomen is soft.     Tenderness: There is no abdominal tenderness. There is no guarding or rebound.  Musculoskeletal:        General: Normal range of motion.     Cervical back: Normal range of motion and neck supple.  Skin:    General: Skin is warm and dry.     Findings: No rash.  Neurological:     Mental Status: She is alert.     Cranial Nerves: No cranial nerve deficit.     ED Results / Procedures / Treatments   Labs (all labs ordered are listed, but only abnormal results are displayed) Labs Reviewed  BASIC METABOLIC PANEL - Abnormal; Notable for the following components:      Result Value   Chloride 113 (*)    Glucose, Bld 116 (*)    Creatinine, Ser 1.23 (*)    GFR calc non Af Amer 50 (*)    GFR calc Af Amer 58 (*)    All other components within normal limits  CBC - Abnormal; Notable for the following components:   Hemoglobin 11.3 (*)    All other components within normal limits  CBG MONITORING, ED - Abnormal; Notable for the following components:   Glucose-Capillary 112 (*)    All other components within normal limits  CBG MONITORING, ED - Abnormal; Notable for the following components:   Glucose-Capillary 63 (*)    All other components within normal limits  CBG MONITORING, ED - Abnormal; Notable for the following components:   Glucose-Capillary 222 (*)    All other components within normal limits  SARS CORONAVIRUS 2  (TAT 6-24 HRS)  TROPONIN I (HIGH SENSITIVITY)  TROPONIN I (HIGH SENSITIVITY)   Results for orders placed or performed during the hospital encounter of 05/22/19  SARS CORONAVIRUS 2 (TAT 6-24 HRS) Nasopharyngeal Nasopharyngeal Swab   Specimen: Nasopharyngeal Swab  Result Value Ref Range   SARS Coronavirus 2 NEGATIVE NEGATIVE  Basic metabolic panel  Result Value Ref Range   Sodium 143 135 - 145 mmol/L   Potassium 3.9 3.5 - 5.1 mmol/L  Chloride 113 (H) 98 - 111 mmol/L   CO2 22 22 - 32 mmol/L   Glucose, Bld 116 (H) 70 - 99 mg/dL   BUN 17 6 - 20 mg/dL   Creatinine, Ser 1.23 (H) 0.44 - 1.00 mg/dL   Calcium 9.6 8.9 - 10.3 mg/dL   GFR calc non Af Amer 50 (L) >60 mL/min   GFR calc Af Amer 58 (L) >60 mL/min   Anion gap 8 5 - 15  CBC  Result Value Ref Range   WBC 7.7 4.0 - 10.5 K/uL   RBC 4.07 3.87 - 5.11 MIL/uL   Hemoglobin 11.3 (L) 12.0 - 15.0 g/dL   HCT 37.0 36.0 - 46.0 %   MCV 90.9 80.0 - 100.0 fL   MCH 27.8 26.0 - 34.0 pg   MCHC 30.5 30.0 - 36.0 g/dL   RDW 14.4 11.5 - 15.5 %   Platelets 205 150 - 400 K/uL   nRBC 0.0 0.0 - 0.2 %  CBG monitoring, ED  Result Value Ref Range   Glucose-Capillary 112 (H) 70 - 99 mg/dL  CBG monitoring, ED  Result Value Ref Range   Glucose-Capillary 63 (L) 70 - 99 mg/dL  CBG monitoring, ED  Result Value Ref Range   Glucose-Capillary 222 (H) 70 - 99 mg/dL  Troponin I (High Sensitivity)  Result Value Ref Range   Troponin I (High Sensitivity) 10 <18 ng/L  Troponin I (High Sensitivity)  Result Value Ref Range   Troponin I (High Sensitivity) 12 <18 ng/L    EKG None  Radiology DG Chest 2 View  Result Date: 05/22/2019 CLINICAL DATA:  Chest pain and shortness of breath EXAM: CHEST - 2 VIEW COMPARISON:  None. FINDINGS: Cardiac shadows within normal limits. The lungs are well aerated bilaterally. No focal infiltrate or sizable effusion is seen. No bony abnormality is noted. IMPRESSION: No active cardiopulmonary disease. Electronically Signed   By:  Inez Catalina M.D.   On: 05/22/2019 23:50    Procedures Procedures (including critical care time)  Medications Ordered in ED Medications  sodium chloride flush (NS) 0.9 % injection 3 mL (has no administration in time range)  albuterol (VENTOLIN HFA) 108 (90 Base) MCG/ACT inhaler 2 puff (has no administration in time range)    ED Course  I have reviewed the triage vital signs and the nursing notes.  Pertinent labs & imaging results that were available during my care of the patient were reviewed by me and considered in my medical decision making (see chart for details).    MDM Rules/Calculators/A&P     CHA2DS2/VAS Stroke Risk Points      N/A >= 2 Points: High Risk  1 - 1.99 Points: Medium Risk  0 Points: Low Risk    A final score could not be computed because of missing components.: Last  Change: N/A     This score determines the patient's risk of having a stroke if the  patient has atrial fibrillation.      This score is not applicable to this patient. Components are not  calculated.                   Patient to ED with chest tightness that she relates to chest congestion and cough. Sxs x 3 days, fever yesterday only.   She is well appearing. Labs reassuring. CXR without infiltrate or opacity. She is given an Albuterol inhaler, 2 puffs, with improvement to cough. Troponin x 2 negative, but doubt ACS given atypical sxs.  No hypoxia. She is well appearing. COVID collected and sent out. She is comfortable with discharge home and will f/u with her doctor for recheck.   A written prescription for Z-pack was provided to the patient. If she develops a recurrent fever - and COVID is negative - she is encouraged to fill the Rx and see her doctor.  Final Clinical Impression(s) / ED Diagnoses Final diagnoses:  None   1. URI  Rx / DC Orders ED Discharge Orders    None       Dennie Bible 05/23/19 2332    Palumbo, April, MD 05/23/19 2344

## 2020-05-24 ENCOUNTER — Emergency Department (HOSPITAL_COMMUNITY): Payer: Medicaid Other

## 2020-05-24 ENCOUNTER — Other Ambulatory Visit: Payer: Self-pay

## 2020-05-24 ENCOUNTER — Encounter (HOSPITAL_COMMUNITY): Payer: Self-pay | Admitting: *Deleted

## 2020-05-24 ENCOUNTER — Emergency Department (HOSPITAL_COMMUNITY)
Admission: EM | Admit: 2020-05-24 | Discharge: 2020-05-25 | Disposition: A | Discharge status: Home / Self Care | Payer: Medicaid Other | Attending: Emergency Medicine | Admitting: Emergency Medicine

## 2020-05-24 DIAGNOSIS — R61 Generalized hyperhidrosis: Secondary | ICD-10-CM | POA: Diagnosis not present

## 2020-05-24 DIAGNOSIS — I129 Hypertensive chronic kidney disease with stage 1 through stage 4 chronic kidney disease, or unspecified chronic kidney disease: Secondary | ICD-10-CM | POA: Diagnosis not present

## 2020-05-24 DIAGNOSIS — R072 Precordial pain: Secondary | ICD-10-CM | POA: Diagnosis not present

## 2020-05-24 DIAGNOSIS — R110 Nausea: Secondary | ICD-10-CM | POA: Diagnosis not present

## 2020-05-24 DIAGNOSIS — R091 Pleurisy: Secondary | ICD-10-CM | POA: Diagnosis not present

## 2020-05-24 DIAGNOSIS — E1140 Type 2 diabetes mellitus with diabetic neuropathy, unspecified: Secondary | ICD-10-CM | POA: Diagnosis not present

## 2020-05-24 DIAGNOSIS — N183 Chronic kidney disease, stage 3 unspecified: Secondary | ICD-10-CM | POA: Diagnosis not present

## 2020-05-24 DIAGNOSIS — Z794 Long term (current) use of insulin: Secondary | ICD-10-CM | POA: Insufficient documentation

## 2020-05-24 DIAGNOSIS — Z7984 Long term (current) use of oral hypoglycemic drugs: Secondary | ICD-10-CM | POA: Diagnosis not present

## 2020-05-24 DIAGNOSIS — Z79899 Other long term (current) drug therapy: Secondary | ICD-10-CM | POA: Diagnosis not present

## 2020-05-24 DIAGNOSIS — R0789 Other chest pain: Secondary | ICD-10-CM | POA: Diagnosis present

## 2020-05-24 LAB — BASIC METABOLIC PANEL
Anion gap: 10 (ref 5–15)
BUN: 20 mg/dL (ref 6–20)
CO2: 23 mmol/L (ref 22–32)
Calcium: 9.3 mg/dL (ref 8.9–10.3)
Chloride: 105 mmol/L (ref 98–111)
Creatinine, Ser: 1.4 mg/dL — ABNORMAL HIGH (ref 0.44–1.00)
GFR, Estimated: 44 mL/min — ABNORMAL LOW (ref >60)
Glucose, Bld: 90 mg/dL (ref 70–99)
Potassium: 4.1 mmol/L (ref 3.5–5.1)
Sodium: 138 mmol/L (ref 135–145)

## 2020-05-24 LAB — CBC
HCT: 36.2 % (ref 36.0–46.0)
Hemoglobin: 11 g/dL — ABNORMAL LOW (ref 12.0–15.0)
MCH: 26.6 pg (ref 26.0–34.0)
MCHC: 30.4 g/dL (ref 30.0–36.0)
MCV: 87.4 fL (ref 80.0–100.0)
Platelets: 192 10*3/uL (ref 150–400)
RBC: 4.14 MIL/uL (ref 3.87–5.11)
RDW: 14.5 % (ref 11.5–15.5)
WBC: 7 10*3/uL (ref 4.0–10.5)
nRBC: 0 % (ref 0.0–0.2)

## 2020-05-24 LAB — I-STAT BETA HCG BLOOD, ED (MC, WL, AP ONLY): I-stat hCG, quantitative: <5 m[IU]/mL (ref <5)

## 2020-05-24 LAB — TROPONIN I (HIGH SENSITIVITY)
Troponin I (High Sensitivity): 4 ng/L (ref <18)
Troponin I (High Sensitivity): 4 ng/L (ref <18)

## 2020-05-24 NOTE — ED Triage Notes (Signed)
The pt has been having chest neck back arm pain for 2 weeks  She reports that it is more painful today  lmp none

## 2020-05-25 ENCOUNTER — Emergency Department (HOSPITAL_COMMUNITY): Payer: Medicaid Other

## 2020-05-25 LAB — D-DIMER, QUANTITATIVE: D-Dimer, Quant: 0.86 ug/mL-FEU — ABNORMAL HIGH (ref 0.00–0.50)

## 2020-05-25 LAB — TROPONIN I (HIGH SENSITIVITY): Troponin I (High Sensitivity): 4 ng/L (ref <18)

## 2020-05-25 MED ORDER — IOHEXOL 350 MG/ML SOLN
80.0000 mL | Freq: Once | INTRAVENOUS | Status: AC | PRN
  Administered 2020-05-25: 80 mL via INTRAVENOUS

## 2020-05-25 MED ORDER — HYDROCODONE-ACETAMINOPHEN 5-325 MG PO TABS
2.0000 | ORAL_TABLET | Freq: Once | ORAL | Status: AC
  Administered 2020-05-25: 2 via ORAL
  Filled 2020-05-25: qty 2

## 2020-05-25 NOTE — ED Notes (Signed)
Patient transported to CT 

## 2020-05-25 NOTE — ED Notes (Signed)
Patient back from CT.

## 2020-05-25 NOTE — ED Notes (Signed)
Patient verbalized understanding of discharge instructions. Opportunity for questions and answers.  

## 2020-05-25 NOTE — ED Provider Notes (Signed)
[facility] EMERGENCY DEPARTMENT Provider Note   CSN: [id number] Arrival date & time: 05/24/20  1638     History Chief Complaint  Patient presents with  . Chest Pain    Courtney Salazar is a 54 y.o. female.  HPI  HPI: A 54 year old patient with a history of hypertension, hypercholesterolemia and obesity presents for evaluation of chest pain. Initial onset of pain was more than 6 hours ago. The patient's chest pain is described as heaviness/pressure/tightness, is sharp and is not worse with exertion. The patient complains of nausea and reports some diaphoresis. The patient's chest pain is middle- or left-sided, is not well-localized and does radiate to the arms/jaw/neck. The patient has no history of stroke, has no history of peripheral artery disease, has not smoked in the past 90 days, denies any history of treated diabetes and has no relevant family history of coronary artery disease (first degree relative at less than age 98).  Patient with history of diabetes, hypertension, hyperlipidemia presents with chest pain.  She reports for 2 weeks she has had intermittent central chest pain that radiates to her back as well as her jaw and left arm.  She initially reported the pain was pressure, but then reports is also sharp and worse with deep breathing.  She reports today that the episodes lasted several minutes and seemed worse.  She reports associated diaphoresis and shortness of breath. No fevers are reported. She reports the pain is becoming severe and disrupting her sleep. Denies known history of CAD/CVD.  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 Arthritis   . Cancer ([place])    unknown  . Diabetes mellitus without complication ([place])   . High cholesterol   . Hypertension   . Lupus ([place])   . Neuropathy   . Osteomyelitis ([place])   . Renal disorder    CKD Stage 3    There are no problems to  display for this patient.   Past Surgical History:  Procedure Laterality Date  . ABDOMINAL HYSTERECTOMY    . AMPUTATION TOE Right    5th  . LAPAROSCOPIC GASTRIC SLEEVE RESECTION       OB History   No obstetric history on file.     No family history on file.  Social History   Tobacco Use  . Smoking status: Never Smoker  . Smokeless tobacco: Never Used  Vaping Use  . Vaping Use: Never used  Substance Use Topics  . Alcohol use: No  . Drug use: No    Home Medications Prior to Admission medications   Medication Sig Start Date End Date Taking? Authorizing Provider  amLODipine (NORVASC) 5 MG tablet Take 5 mg by mouth daily.    [provider]  atorvastatin (LIPITOR) 40 MG tablet Take 40 mg by mouth daily.    [provider]  azithromycin (ZITHROMAX) 250 MG tablet Take 1 tablet (250 mg total) by mouth daily. Take first 2 tablets together, then 1 every day until finished. 05/23/19   Charlann Lange, PA-C  benzonatate (TESSALON) 100 MG capsule Take 1 capsule (100 mg total) by mouth every 8 (eight) hours. 05/23/19   Charlann Lange, PA-C  BYDUREON 2 MG PEN Inject 2 mg into the skin every Wednesday. 05/07/19   [provider]  calcium carbonate 1250 MG capsule Take 1,250 mg by mouth 2 (two) times daily with a meal.    [provider]  diclofenac sodium (VOLTAREN) 1 % GEL Apply 2 g topically 4 (four) times daily.     [provider]  ergocalciferol (VITAMIN D2) 50000 units capsule Take 50,000 Units by mouth every Wednesday.     [provider]  gabapentin (NEURONTIN) 300 MG capsule Take 300 mg by mouth 3 (three) times daily.    [provider]  HUMIRA PEN 40 MG/0.4ML PNKT Take 40 mg by mouth every 14 (fourteen) days. 02/28/19   [provider]  insulin aspart (NOVOLOG FLEXPEN) 100 UNIT/ML FlexPen Inject 2-10 Units into the skin See admin instructions. 180-200 give 2 units, 201-250 give 4 units, 251-300 give 6 units,  301-350 give 8 units, 351-400 give 10 units. 01/18/19   [provider]  insulin detemir (LEVEMIR) 100 UNIT/ML injection Inject 16 Units into the skin at bedtime.     [provider]  losartan-hydrochlorothiazide (HYZAAR) 100-25 MG tablet Take 1 tablet by mouth daily.    [provider]  metFORMIN (GLUCOPHAGE) 500 MG tablet Take 500 mg by mouth 2 (two) times daily with a meal.     [provider]  omeprazole (PRILOSEC) 20 MG capsule Take 20 mg by mouth daily.    [provider]  pregabalin (LYRICA) 100 MG capsule Take 100 mg by mouth 2 (two) times daily.    [provider]  venlafaxine (EFFEXOR) 37.5 MG tablet Take 37.5 mg by mouth 2 (two) times daily.    [provider]    Allergies    Morphine and related  Review of Systems   Review of Systems  Constitutional: Positive for diaphoresis. Negative for fever.  Respiratory: Positive for shortness of breath.   Cardiovascular: Positive for chest pain.  Gastrointestinal: Positive for nausea.  Neurological: Positive for numbness.       Numbness in left arm with chest pain  All other systems reviewed and are negative.   Physical Exam Updated Vital Signs BP (!) 164/69   Pulse 60   Temp (!) 97.5 F (36.4 C) (Oral)   Resp 16   Ht 1.702 m (5\' 7" )   Wt 117.5 kg   SpO2 100%   BMI 40.57 kg/m   Physical Exam CONSTITUTIONAL: Well developed/well nourished HEAD: Normocephalic/atraumatic EYES: EOMI/PERRL ENMT: Mucous membranes moist NECK: supple no meningeal signs SPINE/BACK:entire spine nontender, mild parathoracic tenderness CV: S1/S2 noted, no murmurs/rubs/gallops noted No chest wall tenderness LUNGS: Lungs are clear to auscultation bilaterally, no apparent distress ABDOMEN: soft, nontender, no rebound or guarding, bowel sounds noted throughout abdomen GU:no cva tenderness NEURO: Pt is awake/alert/appropriate, moves all extremitiesx4.  No facial droop.   EXTREMITIES: pulses  normal/equal, full ROM, no calf tenderness or edema SKIN: warm, color normal PSYCH: Mildly anxious and tearful  ED Results / Procedures / Treatments   Labs (all labs ordered are listed, but only abnormal results are displayed) Labs Reviewed  BASIC METABOLIC PANEL - Abnormal; Notable for the following components:      Result Value   Creatinine, Ser 1.40 (*)    GFR, Estimated 44 (*)    All other components within normal limits  CBC - Abnormal; Notable for the following components:   Hemoglobin 11.0 (*)    All other components within normal limits  D-DIMER, QUANTITATIVE (NOT AT [facility]) - Abnormal; Notable for the following components:   D-Dimer, Quant 0.86 (*)    All other components within normal limits  I-STAT BETA HCG BLOOD, ED ([facility], [facility], [facility] ONLY)  TROPONIN I (HIGH SENSITIVITY)  TROPONIN I (  HIGH SENSITIVITY)  TROPONIN I (HIGH SENSITIVITY)    EKG EKG Interpretation  Date/Time:  Monday May 25 2020 01:01:30 EST Ventricular Rate:  63 PR Interval:  214 QRS Duration: 82 QT Interval:  396 QTC Calculation: 406 R Axis:   47 Text Interpretation: Sinus rhythm Anteroseptal infarct, age indeterminate Interpretation limited secondary to artifact Confirmed by Ripley Fraise [phone]) on 05/25/2020 1:08:53 AM   Radiology DG Chest 2 View  Result Date: 05/24/2020 CLINICAL DATA:  Middle chest pain x 2 weeks, radiates down left arm, hx of diabetes, HTN, rheumatoid arthritis, nonsmoker EXAM: CHEST - 2 VIEW COMPARISON:  Chest radiograph 05/22/2019 FINDINGS: The cardiomediastinal contours are within normal limits. The lungs are clear. No pneumothorax or pleural effusion. No acute finding in the visualized skeleton. IMPRESSION: No acute cardiopulmonary process. Electronically Signed   By: Audie Pinto M.D.   On: 05/24/2020 18:27   CT Angio Chest PE W and/or Wo Contrast  Result Date: 05/25/2020 CLINICAL DATA:  Chest pain EXAM: CT ANGIOGRAPHY CHEST WITH CONTRAST TECHNIQUE: Multidetector CT  imaging of the chest was performed using the standard protocol during bolus administration of intravenous contrast. Multiplanar CT image reconstructions and MIPs were obtained to evaluate the vascular anatomy. CONTRAST:  78mL OMNIPAQUE IOHEXOL 350 MG/ML SOLN COMPARISON:  None. FINDINGS: Cardiovascular: Heart is normal size. Aorta is normal caliber. No filling defects in the pulmonary arteries to suggest pulmonary emboli. Mediastinum/Nodes: No mediastinal, hilar, or axillary adenopathy. Trachea and esophagus are unremarkable. Thyroid unremarkable. Lungs/Pleura: Minimal dependent atelectasis. No confluent opacities or effusions. Upper Abdomen: Imaging into the upper abdomen demonstrates no acute findings. Musculoskeletal: Chest wall soft tissues are unremarkable. No acute bony abnormality. Review of the MIP images confirms the above findings. IMPRESSION: No evidence of pulmonary embolus. No acute cardiopulmonary disease. Electronically Signed   By: Rolm Baptise M.D.   On: 05/25/2020 02:52    Procedures Procedures  Medications Ordered in ED Medications  HYDROcodone-acetaminophen (NORCO/VICODIN) 5-325 MG per tablet 2 tablet (2 tablets Oral Given 05/25/20 0113)    ED Course  I have reviewed the triage vital signs and the nursing notes.  Pertinent labs & imaging results that were available during my care of the patient were reviewed by me and considered in my medical decision making (see chart for details).    MDM Rules/Calculators/A&P HEAR Score: 6                         This patient presents to the ED for concern of chest pain, this involves an extensive number of treatment options, and is a complaint that carries with it a high risk of complications and morbidity.  The differential diagnosis includes acute coronary syndrome, pulmonary embolism, aortic dissection, pneumonia, pericarditis   Lab Tests:   I Ordered, reviewed, and interpreted labs, which included troponins, D-dimer, complete  blood count, metabolic panel  Medicines ordered:   I ordered medication Vicodin for pain  Imaging Studies ordered:   I ordered imaging studies which included chest x-ray & CT Chest  I independently visualized and interpreted imaging which showed no acute findings  Additional history obtained:    Previous records obtained and reviewed   Reevaluation:  After the interventions stated above, I reevaluated the patient and found patient is feeling improved after pain meds  1:58 AM Patient presented with chest pain.  Her husband is at bedside who reports she is actually been dealing these issues for least 3 weeks.  The chest pain comes  and goes at random.  She has had difficulty going up steps for quite some time usually gets short of breath but this is not new.  Patient had some symptoms typical for ACS and also symptoms that were similar to PE.  Patient reports previous history of DVT.  D-dimer is elevated we will proceed with CT chest 3:35 AM CT chest is negative.  Patient overall feels improved. At this point given her symptoms have been ongoing for several weeks, no change in her troponin, I feel she is appropriate for discharge home She has been advised to call her PCP for close follow-up and she would benefit from outpatient cardiac testing which may include stress imaging Discussed return precautions with patient and her husband.  They are agreeable with plan Final Clinical Impression(s) / ED Diagnoses Final diagnoses:  Pleurisy  Precordial pain    Rx / DC Orders ED Discharge Orders    None       Ripley Fraise, MD 05/25/20 [phone]

## 2020-07-21 IMAGING — CT CT HEAD CODE STROKE W/O CM
4 series · 15 of 47 positions shown, 17 images · non-contrast
Comparison: None.

CLINICAL DATA: Code stroke.  Left-sided weakness and headache

EXAM:
CT HEAD WITHOUT CONTRAST
TECHNIQUE: Contiguous axial images were obtained from the base of the skull
through the vertex without intravenous contrast.

[Series 3: head wo · axial · 0.46mm/px · z∈[-125,-5]mm · 7 of 32 slices shown, 9 images]
[im 4/32  brain]
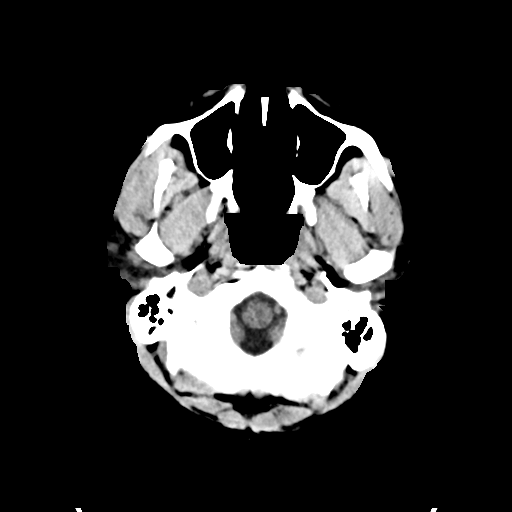
[im 4/32  bone]
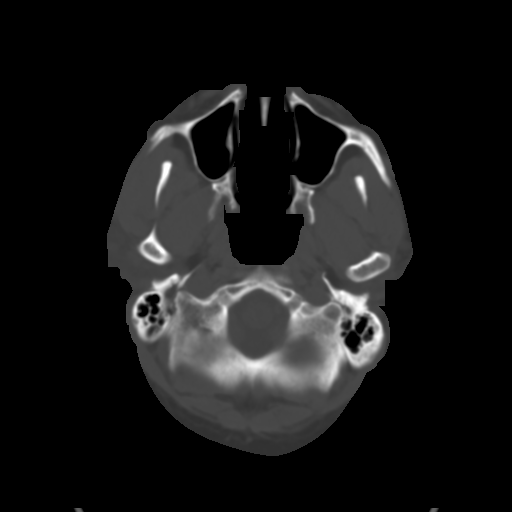
[im 8/32  brain]
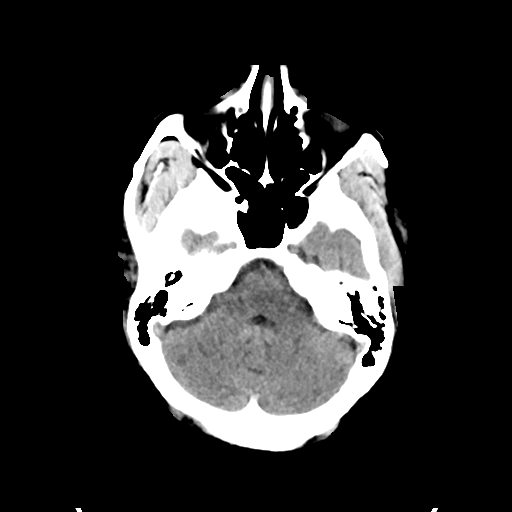
[im 12/32  brain]
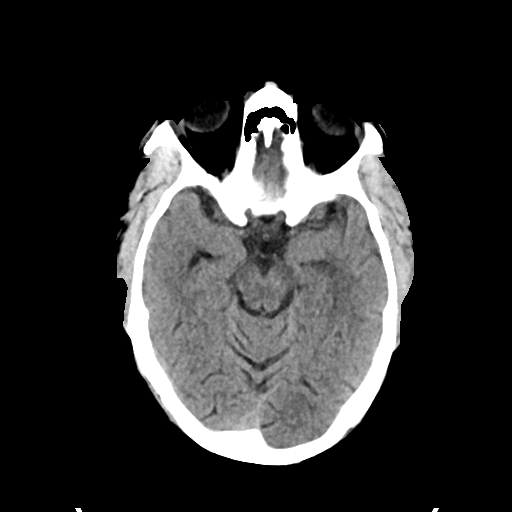
[im 16/32  brain]
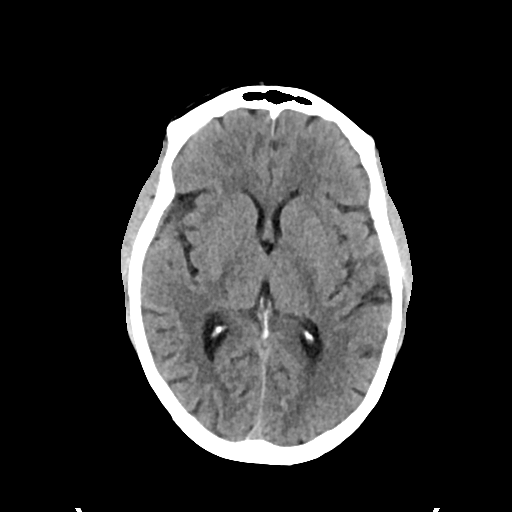
[im 20/32  brain]
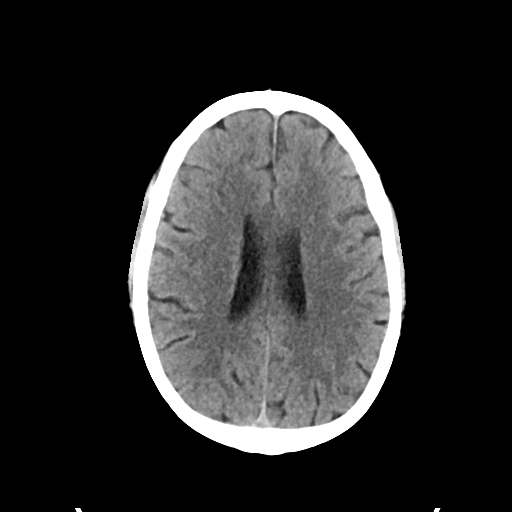
[im 20/32  bone]
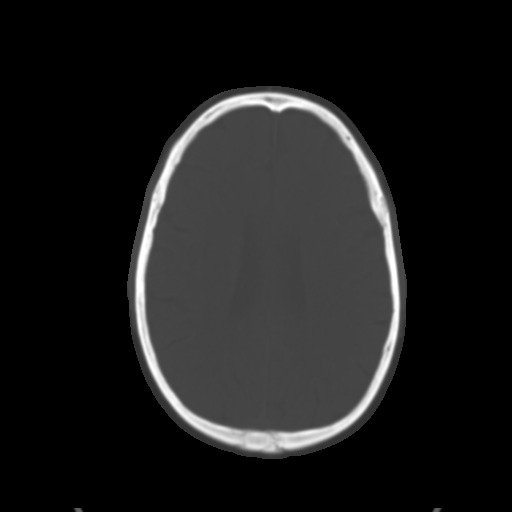
[im 24/32  brain]
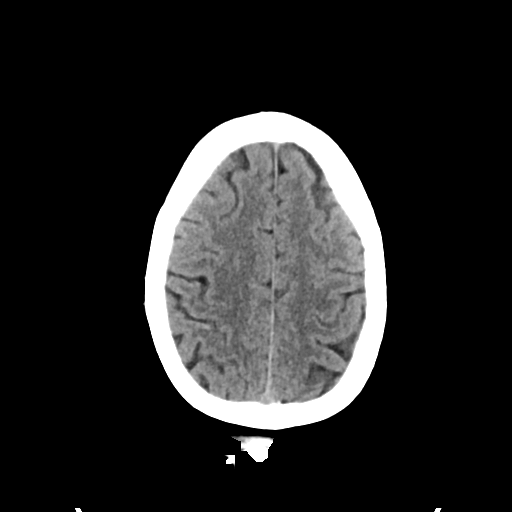
[im 28/32  brain]
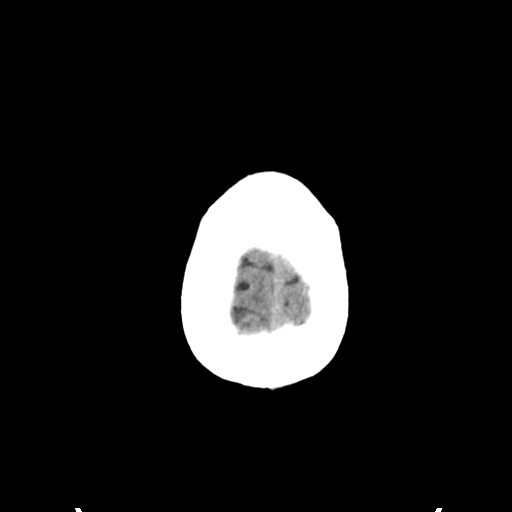

[Series 4: head bone · axial · 0.46mm/px · z∈[-126,-110]mm · 2 of 80 slices shown]
[im 8/80  bone]
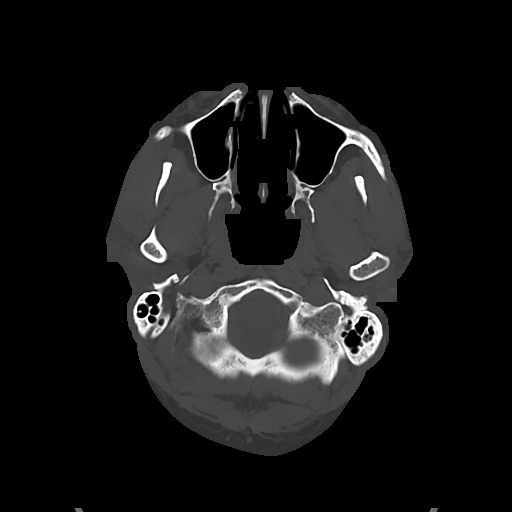
[im 16/80  bone]
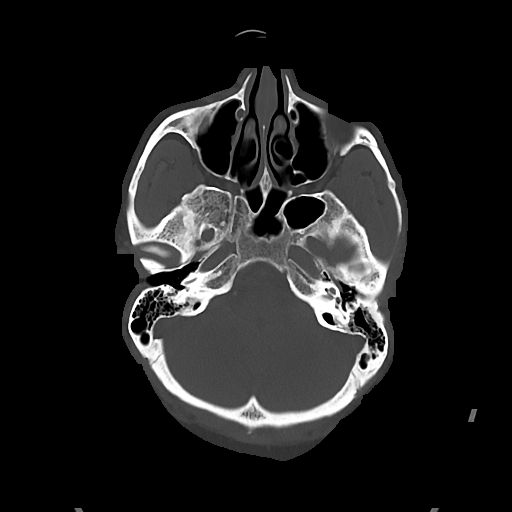

[Series 5: cor soft · coronal · 0.34mm/px · 3 of 72 slices shown]
[im 24/72  brain]
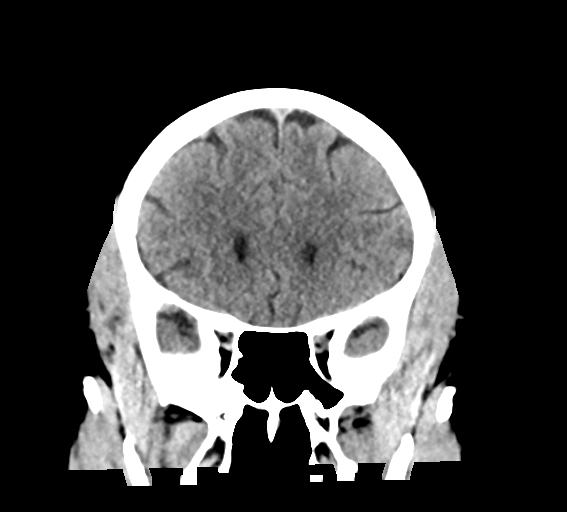
[im 32/72  brain]
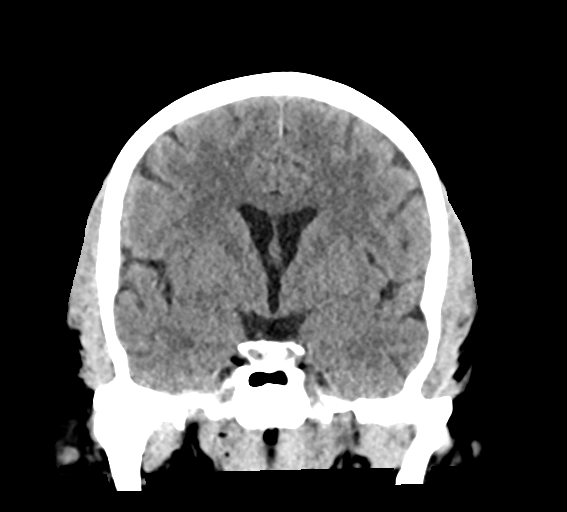
[im 40/72  brain]
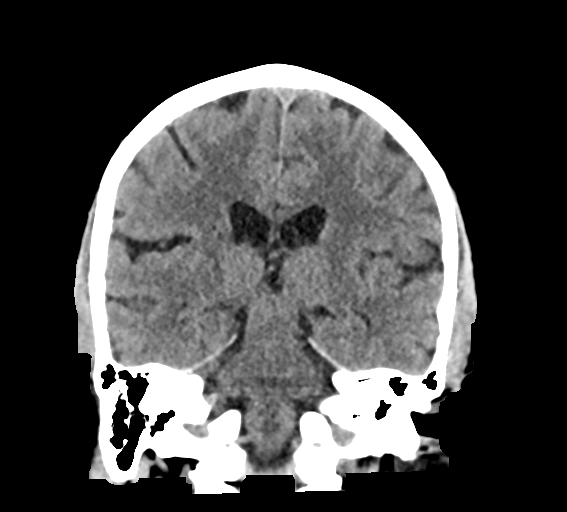

[Series 6: sag soft · sagittal · 0.31mm/px · 3 of 67 slices shown]
[im 23/67  brain]
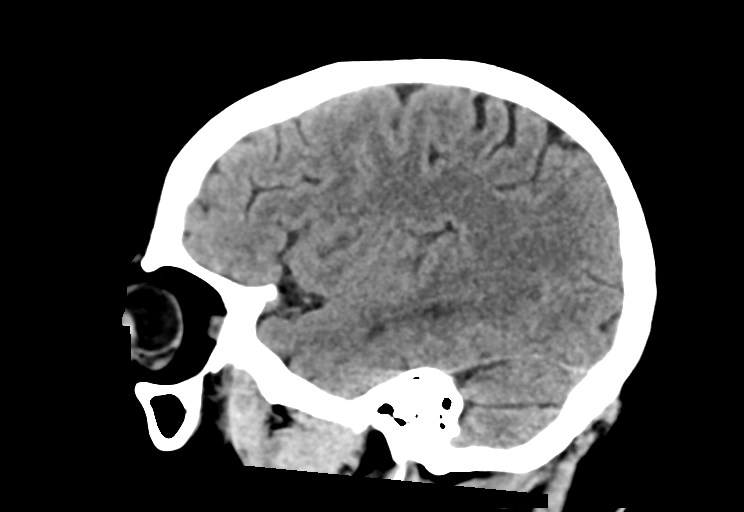
[im 34/67  brain]
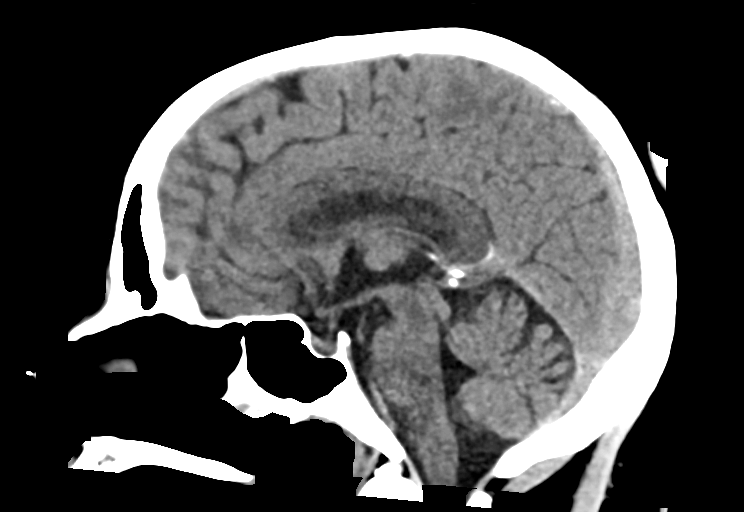
[im 45/67  brain]
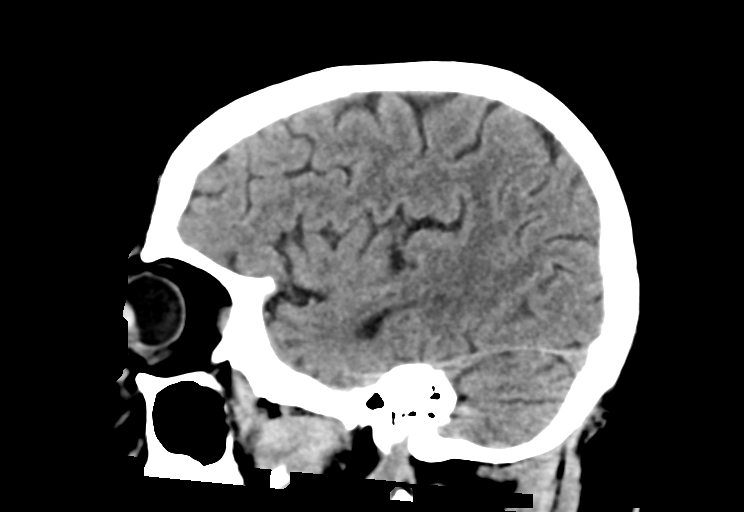

[15 of 47 positions shown; findings below may reference images not displayed]

FINDINGS: Brain: There is no mass, hemorrhage or extra-axial collection. The
size and configuration of the ventricles and extra-axial CSF spaces
are normal. The brain parenchyma is normal, without evidence of
acute or chronic infarction.

Vascular: No abnormal hyperdensity of the major intracranial
arteries or dural venous sinuses. No intracranial atherosclerosis.

Skull: The visualized skull base, calvarium and extracranial soft
tissues are normal.

Sinuses/Orbits: No fluid levels or advanced mucosal thickening of
the visualized paranasal sinuses. No mastoid or middle ear effusion.
The orbits are normal.

ASPECTS ([place] Stroke Program Early CT Score)

- Ganglionic level infarction (caudate, lentiform nuclei, internal
capsule, insula, M1-M3 cortex): 7

- Supraganglionic infarction (M4-M6 cortex): 3

Total score (0-10 with 10 being normal): 10
IMPRESSION: 1. Normal head CT
2. ASPECTS is 10

These results were communicated to Dr. Lenin Coc at [DATE] on 12/04/2018 by text page via the [employer] messaging system.

## 2020-08-04 ENCOUNTER — Ambulatory Visit: Payer: Medicaid Other | Admitting: Orthopedic Surgery

## 2022-09-11 ENCOUNTER — Emergency Department (HOSPITAL_COMMUNITY): Payer: Medicaid Other

## 2022-09-11 ENCOUNTER — Encounter (HOSPITAL_COMMUNITY): Payer: Self-pay

## 2022-09-11 ENCOUNTER — Emergency Department (HOSPITAL_COMMUNITY)
Admission: EM | Admit: 2022-09-11 | Discharge: 2022-09-11 | Disposition: A | Discharge status: Home / Self Care | Payer: Medicaid Other | Attending: Emergency Medicine | Admitting: Emergency Medicine

## 2022-09-11 ENCOUNTER — Other Ambulatory Visit: Payer: Self-pay

## 2022-09-11 DIAGNOSIS — N183 Chronic kidney disease, stage 3 unspecified: Secondary | ICD-10-CM | POA: Insufficient documentation

## 2022-09-11 DIAGNOSIS — E1122 Type 2 diabetes mellitus with diabetic chronic kidney disease: Secondary | ICD-10-CM | POA: Diagnosis not present

## 2022-09-11 DIAGNOSIS — Z7984 Long term (current) use of oral hypoglycemic drugs: Secondary | ICD-10-CM | POA: Diagnosis not present

## 2022-09-11 DIAGNOSIS — I129 Hypertensive chronic kidney disease with stage 1 through stage 4 chronic kidney disease, or unspecified chronic kidney disease: Secondary | ICD-10-CM | POA: Insufficient documentation

## 2022-09-11 DIAGNOSIS — Z794 Long term (current) use of insulin: Secondary | ICD-10-CM | POA: Insufficient documentation

## 2022-09-11 DIAGNOSIS — I880 Nonspecific mesenteric lymphadenitis: Secondary | ICD-10-CM | POA: Diagnosis not present

## 2022-09-11 DIAGNOSIS — K654 Sclerosing mesenteritis: Secondary | ICD-10-CM | POA: Diagnosis not present

## 2022-09-11 DIAGNOSIS — R110 Nausea: Secondary | ICD-10-CM

## 2022-09-11 DIAGNOSIS — R1013 Epigastric pain: Secondary | ICD-10-CM | POA: Diagnosis present

## 2022-09-11 LAB — TROPONIN I (HIGH SENSITIVITY): Troponin I (High Sensitivity): 6 ng/L (ref <18)

## 2022-09-11 LAB — COMPREHENSIVE METABOLIC PANEL
ALT: 57 U/L — ABNORMAL HIGH (ref 0–44)
AST: 161 U/L — ABNORMAL HIGH (ref 15–41)
Albumin: 3.2 g/dL — ABNORMAL LOW (ref 3.5–5.0)
Alkaline Phosphatase: 95 U/L (ref 38–126)
Anion gap: 9 (ref 5–15)
BUN: 42 mg/dL — ABNORMAL HIGH (ref 6–20)
CO2: 23 mmol/L (ref 22–32)
Calcium: 8.9 mg/dL (ref 8.9–10.3)
Chloride: 107 mmol/L (ref 98–111)
Creatinine, Ser: 1.73 mg/dL — ABNORMAL HIGH (ref 0.44–1.00)
GFR, Estimated: 34 mL/min — ABNORMAL LOW (ref >60)
Glucose, Bld: 194 mg/dL — ABNORMAL HIGH (ref 70–99)
Potassium: 4.2 mmol/L (ref 3.5–5.1)
Sodium: 139 mmol/L (ref 135–145)
Total Bilirubin: 0.5 mg/dL (ref 0.3–1.2)
Total Protein: 7.1 g/dL (ref 6.5–8.1)

## 2022-09-11 LAB — I-STAT CHEM 8, ED
BUN: 57 mg/dL — ABNORMAL HIGH (ref 6–20)
Calcium, Ion: 1.24 mmol/L (ref 1.15–1.40)
Chloride: 107 mmol/L (ref 98–111)
Creatinine, Ser: 1.7 mg/dL — ABNORMAL HIGH (ref 0.44–1.00)
Glucose, Bld: 161 mg/dL — ABNORMAL HIGH (ref 70–99)
HCT: 35 % — ABNORMAL LOW (ref 36.0–46.0)
Hemoglobin: 11.9 g/dL — ABNORMAL LOW (ref 12.0–15.0)
Potassium: 4.5 mmol/L (ref 3.5–5.1)
Sodium: 142 mmol/L (ref 135–145)
TCO2: 27 mmol/L (ref 22–32)

## 2022-09-11 LAB — LIPASE, BLOOD: Lipase: 41 U/L (ref 11–51)

## 2022-09-11 LAB — CBC WITH DIFFERENTIAL/PLATELET
Abs Immature Granulocytes: 0.02 10*3/uL (ref 0.00–0.07)
Basophils Absolute: 0 10*3/uL (ref 0.0–0.1)
Basophils Relative: 1 %
Eosinophils Absolute: 0.2 10*3/uL (ref 0.0–0.5)
Eosinophils Relative: 2 %
HCT: 34.4 % — ABNORMAL LOW (ref 36.0–46.0)
Hemoglobin: 10.9 g/dL — ABNORMAL LOW (ref 12.0–15.0)
Immature Granulocytes: 0 %
Lymphocytes Relative: 50 %
Lymphs Abs: 4 10*3/uL (ref 0.7–4.0)
MCH: 27.7 pg (ref 26.0–34.0)
MCHC: 31.7 g/dL (ref 30.0–36.0)
MCV: 87.5 fL (ref 80.0–100.0)
Monocytes Absolute: 0.6 10*3/uL (ref 0.1–1.0)
Monocytes Relative: 8 %
Neutro Abs: 3.1 10*3/uL (ref 1.7–7.7)
Neutrophils Relative %: 39 %
Platelets: 147 10*3/uL — ABNORMAL LOW (ref 150–400)
RBC: 3.93 MIL/uL (ref 3.87–5.11)
RDW: 15.6 % — ABNORMAL HIGH (ref 11.5–15.5)
WBC: 8 10*3/uL (ref 4.0–10.5)
nRBC: 0 % (ref 0.0–0.2)

## 2022-09-11 MED ORDER — METOCLOPRAMIDE HCL 10 MG PO TABS: 10.0000 mg | ORAL_TABLET | Freq: Three times a day (TID) | ORAL | 0 refills | Status: AC | PRN

## 2022-09-11 MED ORDER — METOCLOPRAMIDE HCL 5 MG/ML IJ SOLN
10.0000 mg | Freq: Once | INTRAMUSCULAR | Status: AC
  Administered 2022-09-11: 10 mg via INTRAVENOUS
  Filled 2022-09-11: qty 2

## 2022-09-11 MED ORDER — FENTANYL CITRATE PF 50 MCG/ML IJ SOSY
50.0000 ug | PREFILLED_SYRINGE | Freq: Once | INTRAMUSCULAR | Status: AC
  Administered 2022-09-11: 50 ug via INTRAVENOUS
  Filled 2022-09-11: qty 1

## 2022-09-11 MED ORDER — PREDNISONE 20 MG PO TABS
20.0000 mg | ORAL_TABLET | Freq: Once | ORAL | Status: AC
  Administered 2022-09-11: 20 mg via ORAL
  Filled 2022-09-11: qty 1

## 2022-09-11 MED ORDER — IOPAMIDOL (ISOVUE-370) INJECTION 76%
75.0000 mL | Freq: Once | INTRAVENOUS | Status: AC | PRN
  Administered 2022-09-11: 75 mL via INTRAVENOUS

## 2022-09-11 MED ORDER — IOPAMIDOL (ISOVUE-370) INJECTION 76%
50.0000 mL | Freq: Once | INTRAVENOUS | Status: AC | PRN
  Administered 2022-09-11: 50 mL via INTRAVENOUS

## 2022-09-11 MED ORDER — PREDNISONE 20 MG PO TABS: 20.0000 mg | ORAL_TABLET | Freq: Every day | ORAL | 0 refills | Status: AC

## 2022-09-11 MED ORDER — SODIUM CHLORIDE 0.9 % IV SOLN
25.0000 mg | Freq: Once | INTRAVENOUS | Status: AC
  Administered 2022-09-11: 25 mg via INTRAVENOUS
  Filled 2022-09-11: qty 1

## 2022-09-11 MED ORDER — SODIUM CHLORIDE 0.9 % IV BOLUS
1000.0000 mL | Freq: Once | INTRAVENOUS | Status: AC
  Administered 2022-09-11: 1000 mL via INTRAVENOUS

## 2022-09-11 NOTE — ED Provider Notes (Signed)
[facility] Provider Note   CSN: [id number] Arrival date & time: 09/11/22  [id number]     History  Chief Complaint  Patient presents with   Chest Pain   Abdominal Pain    Courtney Salazar is a 57 y.o. female with past medical history significant for lupus, RA, fibromyalgia, CKD, DM, hyperlipidemia, gastric sleeve surgery (2016), migraines presents to the ED complaining of severe epigastric pain that radiates into her chest.  She describes this pain as sharp and burning, has associated nausea without vomiting.  Patient initially thought pain was related to gas or stool and states she had a small bowel movement this morning that did not improve symptoms.  She reports having gastrointestinal problems and pain since her gastric sleeve surgery.  She has no known history of pancreatitis, liver problems, or gallbladder issues.  Denies fever, chills, diarrhea, vomiting, dysuria, shortness of breath, chest tightness.         Home Medications Prior to Admission medications   Medication Sig Start Date End Date Taking? Authorizing Provider  metoCLOPramide (REGLAN) 10 MG tablet Take 1 tablet (10 mg total) by mouth every 8 (eight) hours as needed for nausea. 09/11/22  Yes Ramiah Helfrich R, PA-C  predniSONE (DELTASONE) 20 MG tablet Take 1 tablet (20 mg total) by mouth daily for 13 days. 09/11/22 09/24/22 Yes Alexei Doswell R, PA-C  atorvastatin (LIPITOR) 40 MG tablet Take 40 mg by mouth daily.    [provider]  atropine 1 % ophthalmic solution Place 1 drop into the left eye at bedtime. 04/29/20   [provider]  benzonatate (TESSALON) 100 MG capsule Take 1 capsule (100 mg total) by mouth every 8 (eight) hours. Patient not taking: No sig reported 05/23/19   Charlann Lange, PA-C  Dulaglutide (TRULICITY) 1.5 0000000 SOPN Inject 1.2 mg into the skin every Wednesday. 02/14/20   [provider]  ergocalciferol (VITAMIN D2) 50000 units capsule Take  50,000 Units by mouth every Wednesday.     [provider]  ferrous sulfate 325 (65 FE) MG tablet Take 325 mg by mouth daily. 08/06/19   [provider]  HUMIRA PEN 40 MG/0.4ML PNKT Take 40 mg by mouth every Wednesday. 02/28/19   [provider]  hydrOXYzine (ATARAX/VISTARIL) 25 MG tablet Take 25-50 mg by mouth at bedtime as needed (sleep). 08/14/19   [provider]  insulin aspart (NOVOLOG FLEXPEN) 100 UNIT/ML FlexPen Inject 2-10 Units into the skin See admin instructions. Pt only uses when she is out of pods for insulin pump   180-200 give 2 units, 201-250 give 4 units, 251-300 give 6 units, 301-350 give 8 units, 351-400 give 10 units. 01/18/19   [provider]  insulin detemir (LEVEMIR) 100 UNIT/ML injection Inject 16 Units into the skin See admin instructions. Only uses when pt is out of pods for pump    [provider]  losartan-hydrochlorothiazide (HYZAAR) 100-25 MG tablet Take 1 tablet by mouth daily.    [provider]  metFORMIN (GLUCOPHAGE) 500 MG tablet Take 500 mg by mouth 2 (two) times daily with a meal.     [provider]  omeprazole (PRILOSEC) 20 MG capsule Take 20 mg by mouth daily.    [provider]  OVER THE COUNTER MEDICATION Take 3 tablets by mouth daily. bariatric vitamins    [provider]  prednisoLONE acetate (PRED FORTE) 1 % ophthalmic suspension Place 1 drop into the left eye See admin instructions. 8  times per day 04/07/20   [provider]  pregabalin (LYRICA) 100 MG capsule Take 100 mg by mouth 2 (two) times daily.    [provider]      Allergies    Morphine and related    Review of Systems   Review of Systems  Constitutional:  Negative for chills and fever.  Respiratory:  Negative for chest tightness and shortness of breath.   Cardiovascular:  Positive for chest pain.  Gastrointestinal:  Positive for abdominal pain (epigastric), constipation and nausea.  Negative for diarrhea and vomiting.  Genitourinary:  Negative for dysuria.    Physical Exam Updated Vital Signs BP 114/62   Pulse (!) 58   Temp 97.8 F (36.6 C) (Oral)   Resp 12   Ht 5\' 7"  (1.702 m)   Wt 117 kg   SpO2 100%   BMI 40.40 kg/m  Physical Exam Vitals and nursing note reviewed.  Constitutional:      General: She is not in acute distress.    Appearance: She is obese. She is not ill-appearing.  HENT:     Mouth/Throat:     Mouth: Mucous membranes are moist.     Pharynx: Oropharynx is clear.  Cardiovascular:     Rate and Rhythm: Normal rate and regular rhythm.     Pulses: Normal pulses.     Heart sounds: Normal heart sounds.  Pulmonary:     Effort: Pulmonary effort is normal. No respiratory distress.     Breath sounds: Normal breath sounds and air entry.  Abdominal:     General: Abdomen is flat. Bowel sounds are normal. There is no distension.     Palpations: Abdomen is soft.     Tenderness: There is generalized abdominal tenderness and tenderness in the right upper quadrant. Negative signs include Murphy's sign.     Hernia: No hernia is present.     Comments: Exquisite tenderness to light palpation of RUQ and epigastric region but negative Murphy's sign.    Skin:    General: Skin is warm and dry.     Capillary Refill: Capillary refill takes less than 2 seconds.  Neurological:     Mental Status: She is alert. Mental status is at baseline.  Psychiatric:        Mood and Affect: Mood normal.        Behavior: Behavior normal.     ED Results / Procedures / Treatments   Labs (all labs ordered are listed, but only abnormal results are displayed) Labs Reviewed  CBC WITH DIFFERENTIAL/PLATELET - Abnormal; Notable for the following components:      Result Value   Hemoglobin 10.9 (*)    HCT 34.4 (*)    RDW 15.6 (*)    Platelets 147 (*)    All other components within normal limits  COMPREHENSIVE METABOLIC PANEL - Abnormal; Notable for the following components:    Glucose, Bld 194 (*)    BUN 42 (*)    Creatinine, Ser 1.73 (*)    Albumin 3.2 (*)    AST 161 (*)    ALT 57 (*)    GFR, Estimated 34 (*)    All other components within normal limits  I-STAT CHEM 8, ED - Abnormal; Notable for the following components:   BUN 57 (*)    Creatinine, Ser 1.70 (*)    Glucose, Bld 161 (*)    Hemoglobin 11.9 (*)    HCT 35.0 (*)    All other components within normal limits  LIPASE, BLOOD  TROPONIN I (HIGH SENSITIVITY)    EKG None  Radiology CT Angio Abd/Pel W and/or Wo Contrast  Result Date: 09/11/2022 CLINICAL DATA:  57 year old female with possible acute mesenteric ischemia. EXAM: CTA ABDOMEN AND PELVIS WITHOUT AND WITH CONTRAST TECHNIQUE: Multidetector CT imaging of the abdomen and pelvis was performed using the standard protocol during bolus administration of intravenous contrast. Multiplanar reconstructed images and MIPs were obtained and reviewed to evaluate the vascular anatomy. RADIATION DOSE REDUCTION: This exam was performed according to the departmental dose-optimization program which includes automated exposure control, adjustment of the mA and/or kV according to patient size and/or use of iterative reconstruction technique. CONTRAST:  41mL ISOVUE-370 IOPAMIDOL (ISOVUE-370) INJECTION 76% COMPARISON:  CT of the abdomen and pelvis 09/11/2022. FINDINGS: VASCULAR Aorta: Normal caliber aorta without aneurysm, dissection, vasculitis or significant stenosis. Celiac: Patent without evidence of aneurysm, dissection, vasculitis or significant stenosis. SMA: Patent without evidence of aneurysm, dissection, vasculitis or significant stenosis. Renals: Both renal arteries are patent without evidence of aneurysm, dissection, vasculitis, fibromuscular dysplasia or significant stenosis. IMA: Patent without evidence of aneurysm, dissection, vasculitis or significant stenosis. Inflow: Patent without evidence of aneurysm, dissection, vasculitis or significant stenosis.  Proximal Outflow: Bilateral common femoral and visualized portions of the superficial and profunda femoral arteries are patent without evidence of aneurysm, dissection, vasculitis or significant stenosis. Veins: No obvious venous abnormality within the limitations of this arterial phase study. Review of the MIP images confirms the above findings. NON-VASCULAR Lower chest: Patchy areas of ground-glass attenuation and mild architectural distortion are noted in the lung bases bilaterally, most evident in the dependent portion of the right lower lobe, similar to prior examinations dating back to 05/25/2020, likely areas of mild chronic post infectious or inflammatory scarring. Hepatobiliary: No suspicious cystic or solid hepatic lesions. No intra or extrahepatic biliary ductal dilatation. Innumerable tiny calcified gallstones lie dependently in the gallbladder. Gallbladder is moderately distended. No pericholecystic fluid or surrounding inflammatory changes. Pancreas: No pancreatic mass. No pancreatic ductal dilatation. No pancreatic or peripancreatic fluid collections or inflammatory changes. Spleen: Unremarkable. Adrenals/Urinary Tract: 1.4 cm low-attenuation lesion in the anterior aspect of the upper pole of the left kidney is compatible with a simple cyst (Bosniak class 1), no imaging follow-up recommended. Right kidney and bilateral adrenal glands are normal in appearance. No hydroureteronephrosis. Urinary bladder is unremarkable in appearance. Stomach/Bowel: Status post sleeve gastrectomy. No pathologic dilatation of small bowel or colon. Normal appendix. Lymphatic: No lymphadenopathy noted in the abdomen or pelvis. Reproductive: Status post hysterectomy. Ovaries are unremarkable in appearance. Other: No significant volume of ascites.  No pneumoperitoneum. Musculoskeletal: There are no aggressive appearing lytic or blastic lesions noted in the visualized portions of the skeleton. IMPRESSION: VASCULAR 1. No acute  vascular abnormality in the abdomen or pelvis. Specifically, no branch occlusion of the mesenteric arteries and no imaging findings to suggest acute mesenteric ischemia. NON-VASCULAR 1. No acute findings are noted in the abdomen or pelvis. 2. Cholelithiasis without evidence of acute cholecystitis. Electronically Signed   By: Vinnie Langton M.D.   On: 09/11/2022 12:16   CT ABDOMEN PELVIS W CONTRAST  Result Date: 09/11/2022 CLINICAL DATA:  epigastric/right upper quadrant pain. EXAM: CT ABDOMEN AND PELVIS WITH CONTRAST TECHNIQUE: Multidetector CT imaging of the abdomen and pelvis was performed using the standard protocol following bolus administration of intravenous contrast. RADIATION DOSE REDUCTION: This exam was performed according to the departmental dose-optimization program which includes automated exposure control, adjustment of the mA and/or kV according to patient  size and/or use of iterative reconstruction technique. CONTRAST:  5mL ISOVUE-370 IOPAMIDOL (ISOVUE-370) INJECTION 76% COMPARISON:  None Available. FINDINGS: Lower chest: Clear lung bases. Mild cardiomegaly, without pericardial or pleural effusion. Hepatobiliary: No contrast is identified, despite contrast administered per technologist notes. Hepatomegaly at 19.9 cm. Multiple dependent gallstones without acute cholecystitis or biliary duct dilatation. Pancreas: Normal, without mass or ductal dilatation. Spleen: Normal in size, without focal abnormality. Adrenals/Urinary Tract: Normal adrenal glands. Too small to characterize upper pole left renal lesion is most likely a cyst . In the absence of clinically indicated signs/symptoms require(s) no independent follow-up. No renal calculi or hydronephrosis. No hydroureter or ureteric calculi. No bladder calculi. Stomach/Bowel: Sleeve gastrectomy. Normal colon and terminal ileum. Normal appendix. Normal small bowel. Vascular/Lymphatic: Aortic atherosclerosis. Circumaortic left renal vein. Increased  density in the jejunal mesenteric fat with small mesenteric nodes. No abdominopelvic adenopathy. Reproductive: Hysterectomy.  No adnexal mass. Other: No significant free fluid.  No free intraperitoneal air. Musculoskeletal: No acute osseous abnormality. Minimal motion degradation in the upper abdomen. IMPRESSION: 1. No significant contrast is identified, despite contrast being administered per technologist notes. I have communicated with technologists to evaluate the patient for possible contrast extravasation. 2. Cholelithiasis 3. Increased density in the jejunal mesenteric fat with small nodes within. This could represent mesenteric adenitis/panniculitis, but can also be seen in asymptomatic individuals. 4.  Aortic Atherosclerosis (ICD10-I70.0). Electronically Signed   By: Abigail Miyamoto M.D.   On: 09/11/2022 09:59   DG Chest 2 View  Result Date: 09/11/2022 CLINICAL DATA:  57 year old female with history of chest and epigastric pain. EXAM: CHEST - 2 VIEW COMPARISON:  Chest x-ray 05/24/2020. FINDINGS: Image is slightly under penetrated. Lung volumes are normal. No consolidative airspace disease. No pleural effusions. No pneumothorax. No pulmonary nodule or mass noted. Pulmonary vasculature is normal. Heart size is mildly enlarged. Upper mediastinal contours are within normal limits. IMPRESSION: 1. Mild cardiomegaly. Electronically Signed   By: Vinnie Langton M.D.   On: 09/11/2022 07:42    Procedures Procedures    Medications Ordered in ED Medications  sodium chloride 0.9 % bolus 1,000 mL (0 mLs Intravenous Stopped 09/11/22 1056)  promethazine (PHENERGAN) 25 mg in sodium chloride 0.9 % 50 mL IVPB (0 mg Intravenous Stopped 09/11/22 0855)  fentaNYL (SUBLIMAZE) injection 50 mcg (50 mcg Intravenous Given 09/11/22 0833)  iopamidol (ISOVUE-370) 76 % injection 50 mL (50 mLs Intravenous Contrast Given 09/11/22 0939)  metoCLOPramide (REGLAN) injection 10 mg (10 mg Intravenous Given 09/11/22 1053)  iopamidol  (ISOVUE-370) 76 % injection 75 mL (75 mLs Intravenous Contrast Given 09/11/22 1159)  predniSONE (DELTASONE) tablet 20 mg (20 mg Oral Given 09/11/22 1322)    ED Course/ Medical Decision Making/ A&P                             Medical Decision Making Amount and/or Complexity of Data Reviewed Labs: ordered. Radiology: ordered.  Risk Prescription drug management.   This patient presents to the ED with chief complaint(s) of RUQ and epigastric pain with pertinent past medical history of gastric sleeve surgery (2016), DM, CKD, hypertension, lupus, fibromyalgia, RA.  The complaint involves an extensive differential diagnosis and also carries with it a high risk of complications and morbidity.    The differential diagnosis includes acute cholecystitis, symptomatic cholelithiasis, pancreatitis, hepatitis, gastritis, gastroenteritis, PUD, GERD  The initial plan is to obtain abdominal pain workup  Additional history obtained: Records reviewed  patient seen by endocrinology  for medication management.  Patient was on [facility] for a year before backorder occurred.  Patient has not been able to get her [facility] for 2 months.  Patient currently managing her diabetes with NovoLog, Jardiance, Trulicity.  Patient takes Humira for her autoimmune disease.  Initial Assessment:   Exam significant for a patient who appears uncomfortable but is not in acute distress.  Exquisite tenderness to palpation of the right upper quadrant and epigastric regions.  No positive Murphy sign.  No pain at McBurney's point.  No appreciable hernias.  Abdomen is soft and nondistended.  All other quadrants are unremarkable.  Heart rate is normal with regular rhythm.  Respiratory effort is normal.  Lungs clear to auscultation.    Independent ECG/labs interpretation:  The following labs were independently interpreted:  CBC with mild anemia, near patient's baseline, appears to be chronic.  No leukocytosis.  There is mild  thrombocytopenia.  Metabolic panel significant for elevated creatinine above patient's baseline and worsened GFR since last value.  LFTs are also elevated.  No major electrolyte derangement.  Anion gap is normal. Lipase is within normal range.  Troponin is negative.  Independent visualization and interpretation of imaging: I independently visualized the following imaging with scope of interpretation limited to determining acute life threatening conditions related to emergency care: CT abdomen pelvis, which revealed evidence of possible mesenteric adenitis/panniculitis.  There is cholelithiasis without acute cholecystitis.  Chest x-ray was also performed which showed mild cardiomegaly, no pleural effusion or infiltrate.  I agree with radiologist interpretation. Based on findings of CT, will order CTA to rule out mesenteric ischemia as cause of patient's severe abdominal pain.  CTA without evidence of mesenteric ischemia or thrombosis.  Treatment and Reassessment: Patient's pain and nausea treated with fentanyl and Phenergan.  Patient reports her pain has improved but she is still feeling nauseated.  Will give fluids and IV Reglan.  Upon reassessment, patient is resting comfortably in bed.  She reports that her pain and nausea have significantly improved.  Suspect patient's symptoms may be related to CT findings of mesentery adenitis/panniculitis.  Will send patient home on course of prednisone with her first dose given here in the ED to treat the inflammatory process.  Disposition:   Discussed with patient she will need to closely monitor her blood sugars while she is taking prednisone daily.  Advised patient to call her primary care provider tomorrow to set up a follow-up as soon as possible.  Patient also sent home with prescription for Reglan to help with nausea.  Patient is agreeable with the plan and verbalizes her understanding.  The patient has been appropriately medically screened and/or  stabilized in the ED. I have low suspicion for any other emergent medical condition which would require further screening, evaluation or treatment in the ED or require inpatient management. At time of discharge the patient is hemodynamically stable and in no acute distress. I have discussed work-up results and diagnosis with patient and answered all questions. Patient is agreeable with discharge plan. We discussed strict return precautions for returning to the emergency department and they verbalized understanding.           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

## 2022-09-11 NOTE — ED Triage Notes (Signed)
Pt was awoken by strong epigastric and chest pain. 10/10 pain very uncomfortable. Sharp pain.  No radiation. NSR.   4mg  Zofran in EMS No aspirin or nitro  History- Lupus, RA, Fibromyalgia,HTN, CKD III, DM II, Migraines, DVT in past.  Surgery of gastric sleeve. Has had normal Bms

## 2022-09-11 NOTE — Discharge Instructions (Addendum)
Thank you for allowing me to be a part of your care today.  Your workup showed you may have inflammation that affects the tissue around the intestines.   I have sent a prescription for prednisone for you to take daily.  Please monitor your blood sugars closely while taking this medication.  Begin taking this medication tomorrow, you got your first dose while in the ER.   I have also sent over a prescription for Reglan (metoclopramide) which you may take if you experience nausea/vomiting.  Call you primary care provider tomorrow and schedule a follow-up appointment as soon as possible.   Return to the ED if you have worsening of your symptoms or if you have any new concerns.

## 2022-12-26 ENCOUNTER — Other Ambulatory Visit (HOSPITAL_COMMUNITY): Payer: Self-pay

## 2022-12-26 MED ORDER — MOUNJARO 15 MG/0.5ML ~~LOC~~ SOAJ
15.0000 mg | SUBCUTANEOUS | 6 refills | Status: AC
  Filled 2022-12-26: qty 2, 28d supply, fill #0
  Filled 2023-01-23: qty 2, 28d supply, fill #1
  Filled 2023-03-03: qty 2, 28d supply, fill #2
  Filled 2023-04-08: qty 2, 28d supply, fill #3
  Filled 2023-05-07 – 2023-05-16 (×7): qty 2, 28d supply, fill #4

## 2022-12-28 ENCOUNTER — Other Ambulatory Visit (HOSPITAL_COMMUNITY): Payer: Self-pay

## 2023-01-26 ENCOUNTER — Other Ambulatory Visit (HOSPITAL_COMMUNITY): Payer: Self-pay

## 2023-03-11 ENCOUNTER — Other Ambulatory Visit (HOSPITAL_COMMUNITY): Payer: Self-pay

## 2023-03-11 ENCOUNTER — Other Ambulatory Visit (HOSPITAL_BASED_OUTPATIENT_CLINIC_OR_DEPARTMENT_OTHER): Payer: Self-pay

## 2023-03-16 ENCOUNTER — Other Ambulatory Visit (HOSPITAL_COMMUNITY): Payer: Self-pay

## 2023-04-13 ENCOUNTER — Other Ambulatory Visit (HOSPITAL_COMMUNITY): Payer: Self-pay

## 2023-04-18 ENCOUNTER — Other Ambulatory Visit (HOSPITAL_COMMUNITY): Payer: Self-pay

## 2023-04-18 MED ORDER — MOUNJARO 15 MG/0.5ML ~~LOC~~ SOAJ
15.0000 mg | SUBCUTANEOUS | 6 refills | Status: DC
  Filled 2023-05-07 – 2023-05-16 (×5): qty 2, 28d supply, fill #0

## 2023-05-08 ENCOUNTER — Other Ambulatory Visit (HOSPITAL_COMMUNITY): Payer: Self-pay

## 2023-05-09 ENCOUNTER — Other Ambulatory Visit (HOSPITAL_COMMUNITY): Payer: Self-pay

## 2023-05-10 ENCOUNTER — Other Ambulatory Visit (HOSPITAL_COMMUNITY): Payer: Self-pay

## 2023-05-12 ENCOUNTER — Other Ambulatory Visit (HOSPITAL_COMMUNITY): Payer: Self-pay

## 2023-05-14 ENCOUNTER — Other Ambulatory Visit (HOSPITAL_COMMUNITY): Payer: Self-pay

## 2023-05-16 ENCOUNTER — Other Ambulatory Visit (HOSPITAL_COMMUNITY): Payer: Self-pay

## 2024-03-20 ENCOUNTER — Telehealth: Payer: Self-pay

## 2024-03-20 NOTE — Telephone Encounter (Signed)
 Referral clinician from [facility] left a VM requesting a callback to discuss a referral sent for pt.

## 2024-03-21 NOTE — Telephone Encounter (Signed)
 This is not a patient of ours, we did not send referral

## 2024-03-22 NOTE — Progress Notes (Signed)
 Subjective   HPI:  Patient is here for visit for follow up and treatment of chronic metabolic issues. She is  9 year status post  SG performed. Weight loss from time of surgery has been 6 lbs.  Past year's visits were reviewed with nutrition, as well any admissions or ER visits. We discussed her long term weight loss goals and the necessary components of hitting protein target every single day, regular weight training exercise, and taking nutritional supplements and their key roles in achieving sustainable success.  Recent labs were reviewed as below with special attention to chronic abnormalities.   Patient complaining of struggling with weight loss. She states she has been struggling with eating habits and exercise.  She is requesting phentermine  Follow-Up Questions:  Comorbidity List After Surgery: Bzd:Ipjazuzd Bzd:Pwdlopw Yes:Non-Insulin Bzd:HZMI requiring medicine  Bzd:Ybezmopepizfpj  Bzd:Ybezmuzwdpnw7 # or Medications ___losartan and HCTZ_____ Wn:Dozze Apnea- has not used CPAP since surgery but requesting referral back to sleep medicine for reevaluation   she denies any problems with:  nausea or vomiting no, melena or bloody stools no, diarrhea no, constipation no, dysphagia no, excessive weight loss no.   Please see changes in medications compared to preop.  She is/is not  taking below listed supplements as prescribed in the manual.   PPI- not taking  Citrucel- yes  MVI -yes 50/50 consistent  Actigall- no  Pepto Bismol -no  Calcium Citrate -yes  Vitamin D -yes   she  is not doing resistance training. We discussed short and long term importance of regular exercise to maximize the metabolic benefits of the operation and lifestyle changes, as well as to prevent or minimize any muscle wasting. In particular, we talked about at least twice a week performing a typically cardio oriented exercise program, while 3 days a week performing resistance training. We also discussed  variations on above suggested schedule. Also gave she information about exercising at home while gyms are closed and / or weather is bad.  she is  Complaining of  GERD and weight gain   denies vomiting, abdominal pain, fussiness, diarrhea, cough, and difficulty breathing Latex allergy  Past Medical History:  Diagnosis Date  . Anemia 5/-998  . Arthritis    RA  . Chipped tooth    SINCE CHILDHOOD  . Depression   . Diabetes mellitus (*)    WELL CONTROLLED. LAST A1C: 7  . DVT (deep venous thrombosis) (*) 10/10/2014   CAUSED BY PICC LINE, WAS ON ELIQUIS FROM 09/2014-12/2014, BLOOD CLOT DISSOLVED  . GERD (gastroesophageal reflux disease) 06/2013  . Hyperlipidemia   . Hypertension   . Insomnia   . Lupus   . Missing tooth, acquired   . Neuropathy    Diabetic - DIABETIC  . OSA on CPAP    11/2013 RE-TESTED FOR SLEEP APNEA, TESTING WAS NEGATIVE, NO LONGER NEEDS CPAP  . Retinopathy, diabetic, bilateral (*)    Dr. Carmela  . Type II or unspecified type diabetes mellitus with neurological manifestations, uncontrolled(250.62) 10/11/2012  . 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 Cholecystectomy  02/19/2024  . Colonoscopy    . Colonoscopy  11/2014   5 year recall/[facility]  . Hysterectomy  11/2007   LAPAROSCOPIC----ENDOMETRIAL POLYPS  . Sleeve gastrectomy    . Toe amputation  11/29/2014   Dr Jerilynn Cain/ left fifth toe  . Upper gastrointestinal endoscopy  01/05/2017   Medications Ordered Prior to Encounter[1] Allergies:  Allergies[2] Social History[3] Family History  Problem Relation Age of Onset  . Coronary artery disease Mother   . Heart failure Mother   . Hypertension Mother   . COPD Mother   . Ovarian cancer Mother   . Lupus Mother   . Stroke Mother   . Arthritis Mother        Rheumatoid  . Diabetes Mother   . Coronary artery disease Father   .  Diabetes Father   . Hypertension Father   . Heart disease Father   . Kidney disease Father   . Diabetes Sister   . Diabetes Brother   . Diabetes Brother   . Diabetes Brother   . Diabetes Son   . Asthma Son   . Breast cancer Maternal Grandmother   . Lupus Maternal Grandmother   . Birth defects Maternal Grandmother   . Rheumatic fever Maternal Grandmother   . Arthritis Maternal Grandmother        Rheumatoid  . Birth defects Paternal Grandmother   . Lupus Cousin   . Colon cancer Neg Hx    ROS: Vitamin Deficiencies denies evidence of calcium deficiency such as altered mental status, tetanus, generalized weakness denies evidence of L- Carnitine deficiency such as lipid intolerance, encephalopathy denies evidence of cobalamin deficiency such as general weakness, anemia denies evidence of copper deficiency including fatigue, bleeding under the skin, anemia, cardiomegaly denies evidence of folate deficiency including generalized weakness, anemia GI discomfort denies evidence of iron deficiency including fatigue shortness of breath malaise denies evidence of thiamine deficiency such as numbness in the fingers or toes, neuropathy, irritation, encephalopathy denies evidence of vitamin A deficiency such as night blindness denies evidence of vitamin D deficiency such as bone or joint pain, depression denies evidence of zinc deficiency such as skin disorders or hair loss  Objective  BP 126/84 (BP Location: Left Upper Arm, Patient Position: Sitting)   Pulse 74   Ht 5' 6 (1.676 m)   Wt 260 lb 9.6 oz (118.2 kg)   SpO2 97%   BMI 42.06 kg/m  Chaperone is not present for exam Physical Examination:  GENERAL ASSESSMENT: well developed and well nourished, obese SKIN: normal color, no lesions, non-icteric, no petechiae, no ecchymoses HEAD: normocephalic, atraumatic EYES: non-icteric sclerae EARS: normal external appearance NOSE: normal external appearance and nares patent MOUTH:  moist  mucosa, no dry mucosa, no tongue coating, multiple teeth present and grossly normal NECK: normal supple full range of motion and no adenopathy or masses, trachea midline, no carotid bruits, no JVD CHEST: normal air exchange, no rales, no rhonchi, no wheezes, respiratory effort normal with no retractions HEART: regular rate and rhythm, normal S1/S2, no murmurs, no thrills, no rubs, no gallops ABDOMEN: soft, obese, non-distended, no masses, no hepatosplenomegaly, incisions are well healed with no herniation EXTREMITY: normal and symmetric movement, normal range of motion, no joint swelling NEURO: strength normal and symmetric, alert and oriented X 3, gait normal Psycho/social: normal affect and behavior  Lab on 03/14/2024  Component Date Value Ref Range Status  . Vitamin B1 03/14/2024 79.6  66.5 - 200.0 nmol/L Final  . Vitamin B-12 03/14/2024 >2000 (H)  232 - 1245 pg/mL Final  . Folate 03/14/2024 2.5 (L)  >3.0 ng/mL Final  . Vit D, 25-Hydroxy 03/14/2024 23.0 (L)  30.0 - 100.0 ng/mL Final  . Prealbumin 03/14/2024 28  10 - 36 mg/dL Final  . Ferritin 89/97/7974 49  15 - 150 ng/mL Final  . Glucose 03/14/2024 166 (H)  70 -  99 mg/dL Final  . BUN 89/97/7974 20  6 - 24 mg/dL Final  . Creatinine 89/97/7974 1.47 (H)  0.57 - 1.00 mg/dL Final  . eGFR 89/97/7974 41 (L)  >59 mL/min/1.73 Final  . Interpretation 03/14/2024 Comment   Final  . BUN/Creatinine Ratio 03/14/2024 14  9 - 23 Final  . Sodium 03/14/2024 142  134 - 144 mmol/L Final  . Potassium 03/14/2024 4.8  3.5 - 5.2 mmol/L Final  . Chloride 03/14/2024 105  96 - 106 mmol/L Final  . CO2 03/14/2024 24  20 - 29 mmol/L Final  . CALCIUM 03/14/2024 9.7  8.7 - 10.2 mg/dL Final  . Total Protein 03/14/2024 7.1  6.0 - 8.5 g/dL Final  . Albumin, Serum 03/14/2024 4.0  3.8 - 4.9 g/dL Final  . Globulin, Total 03/14/2024 3.1  1.5 - 4.5 g/dL Final  . Total Bilirubin 03/14/2024 0.4  0.0 - 1.2 mg/dL Final  . Alkaline Phosphatase 03/14/2024 108  49 - 135 IU/L  Final  . AST 03/14/2024 20  0 - 40 IU/L Final  . ALT (SGPT) 03/14/2024 19  0 - 32 IU/L Final  . WBC 03/14/2024 7.7  3.4 - 10.8 x10E3/uL Final  . RBC 03/14/2024 4.14  3.77 - 5.28 x10E6/uL Final  . Hemoglobin 03/14/2024 11.6  11.1 - 15.9 g/dL Final  . Hematocrit 89/97/7974 37.5  34.0 - 46.6 % Final  . MCV 03/14/2024 91  79 - 97 fL Final  . MCH 03/14/2024 28.0  26.6 - 33.0 pg Final  . MCHC 03/14/2024 30.9 (L)  31.5 - 35.7 g/dL Final  . RDW 89/97/7974 13.5  11.7 - 15.4 % Final  . Platelet Count 03/14/2024 138 (L)  150 - 450 x10E3/uL Final  . Neutrophils 03/14/2024 37  Not Estab. % Final  . Lymphs Relative 03/14/2024 51  Not Estab. % Final  . Monocytes 03/14/2024 7  Not Estab. % Final  . Eos Relative 03/14/2024 4  Not Estab. % Final  . Basos Relative  03/14/2024 1  Not Estab. % Final  . Neutrophils Absolute 03/14/2024 2.8  1.4 - 7.0 x10E3/uL Final  . Lymphocytes Absolute 03/14/2024 3.9 (H)  0.7 - 3.1 x10E3/uL Final  . Monocytes Absolute 03/14/2024 0.5  0.1 - 0.9 x10E3/uL Final  . Eosinophils Absolute 03/14/2024 0.3  0.0 - 0.4 x10E3/uL Final  . Basophils Absolute 03/14/2024 0.1  0.0 - 0.2 x10E3/uL Final  . Immature Granulocytes 03/14/2024 0  Not Estab. % Final  . Immature Grans (Abs) 03/14/2024 0.0  0.0 - 0.1 x10E3/uL Final  . Cholesterol, Total 03/14/2024 265 (H)  100 - 199 mg/dL Final  . Triglycerides 03/14/2024 81  0 - 149 mg/dL Final  . HDL 89/97/7974 82  >39 mg/dL Final  . VLDL Cholesterol Cal 03/14/2024 13  5 - 40 mg/dL Final  . LDL 89/97/7974 170 (H)  0 - 99 mg/dL Final  . Hemoglobin J8r 03/14/2024 7.6 (H)  4.8 - 5.6 % Final  . SPECIMEN STATUS REPORT 03/14/2024 Comment   Final  . SPECIMEN STATUS REPORT 03/14/2024 Comment   Final   Assessment   1. History of sleep apnea   2. Class 3 obesity (*)    Plan  Courtney Salazar has done well post bariatric surgery. she is status post laparoscopic sleeve gastrectomy at [facility]. she has been compliant with our  bariatric post operative requirements.  Current BMI is Body mass index is 42.06 kg/m..  she has had resolution or, at least, improvement of her  obesity related co-morbidities.  Labs reviewed today  she has obesity-related medical problems including:  1. History of sleep apnea   2. Class 3 obesity (*)     Exercise, including three times per week resistance training stressed along with at least twice per week of cardio related exercise. We discussed in detail the differences and advantages of each..  Protein intake of 80+ gms per day stressed. Offered if patient had any concerns or confusion that we arrange follow up appointment with nutritionist.  Follow up in 1 year with labs.  Lifestyle and supplementation per AVS.   Will plan to start phentermine 37.5mg  once daily. Has taken in the past and has tolerated well.  Medication information provided via AVS  Medical referral to [facility]  I have reviewed the discharge plans and course since discharge. After discussion, the patient understands these plans and questions related to the plans have been addressed       [1] Current Outpatient Medications on File Prior to Visit  Medication Sig Dispense Refill  . acetaminophen  (TYLENOL  ARTHRITIS,MAPAP) 650 MG CR tablet Take by mouth.    SABRA adalimumab (HUMIRA) 40 MG/0.8ML injection Inject 0.8 mLs (40 mg total) into the skin once a week. (Patient not taking: Reported on 03/22/2024) 4 vial 2  . amLODIPine besylate (NORVASC) 10 mg tablet Take one tablet (10 mg dose) by mouth daily.    SABRA atorvastatin (LIPITOR) 40 mg tablet TAKE ONE TABLET (40 MG TOTAL) BY MOUTH DAILY. (Patient not taking: Reported on 03/22/2024) 90 tablet 1  . atorvastatin (LIPITOR) 80 mg tablet Take one tablet (80 mg dose) by mouth daily.    . Calcium Carbonate (CALTRATE 600 PO) Take 1 tablet by mouth daily.    . ciclopirox (PENLAC) 8% solution Apply topically.    . Continuous Blood Gluc Sensor (DEXCOM G6 SENSOR) MISC by Does not apply  route.    . Continuous Blood Gluc Transmit (DEXCOM G6 TRANSMITTER) MISC PLEASE SEE ATTACHED FOR DETAILED DIRECTIONS    . dapagliflozin (FARXIGA) 5 MG tablet TAKE 1 TABLET (5 MG TOTAL) BY MOUTH DAILY. (Patient not taking: Reported on 03/22/2024)    . ergocalciferol (VITAMIN D2) 50,000 units CAPS capsule TAKE 1 CAPSULE (50,000 UNITS TOTAL) BY MOUTH 2 TIMES A WEEK. 8 capsule 3  . ferrous sulfate (FERROUS SULFATE) 325 (65 FE) MG tablet Take one tablet (325 mg dose) by mouth 1 (one) time each day with breakfast.    . furosemide (LASIX) 20 mg tablet Take one tablet (20 mg dose) by mouth daily as needed. (Patient not taking: Reported on 03/16/2023)    . glucagon 1 MG injection INJECT 1 ML INTO THE MUSCLE ONCE FOR 1 DOSE. FOR HYPOGLYCEMIA    . glucose blood (ACCU-CHEK AVIVA PLUS) test strip Check sugars 3-4 times per day directly prior to eating food at meals, at bedtime, and with symptoms of high or low blood sugars. Use as instructed 100 each 5  . hydrochlorothiazide (HYDRODIURIL) 25 mg tablet Take one tablet (25 mg dose) by mouth daily.    . hydrOXYzine HCl (ATARAX) 25 mg tablet     . insulin aspart (NOVOLOG FLEXPEN) 100 unit/mL injection Inject fifteen Units into the skin 15 (fifteen) minutes before meals. Up to 20 units before meals on a sliding scale    . Insulin Detemir (LEVEMIR FLEXPEN [place]) Inject 22 Units into the skin. AM (Patient not taking: Reported on 03/22/2024)    . insulin detemir (LEVEMIR) 100 Unit/mL injection Administer 20 units in the morning  and 18 units at night. (Patient not taking: Reported on 03/22/2024)    . Insulin Disposable Pump (OMNIPOD 5 PACK) MISC by Does not apply route.    SABRA JARDIANCE 10 MG TABS tablet Take one tablet (10 mg dose) by mouth daily.    . Lancets 30G MISC 1 Container by Misc.(Non-Drug; Combo Route) route 4 times daily before meals and nightly.    SABRA LINZESS 145 MCG capsule Take one capsule (145 mcg dose) by mouth as needed.    SABRA losartan potassium (COZAAR) 100 mg  tablet Take one tablet (100 mg dose) by mouth daily.    SABRA losartan-hydrochlorothiazide (HYZAAR) 100-25 MG per tablet Take by mouth.    . Misc. Devices MISC Accu Check Aviva Monitor... Use as directed (Patient not taking: Reported on 03/22/2024) 1 each 0  . MOUNJARO  15 MG/0.5ML SOPN SMARTSIG:SUB-Q    . MOUNJARO  5 MG/0.5ML SOPN SMARTSIG:5 Milligram(s) SUB-Q Once a Week (Patient not taking: Reported on 03/16/2023)    . MOUNJARO  7.5 MG/0.5ML SOPN SMARTSIG:SUB-Q (Patient not taking: Reported on 03/16/2023)    . Multiple Vitamins-Minerals (MULTIVITAMIN WITH MINERALS) tablet Take one tablet by mouth every morning.    . mupirocin (BACTROBAN) 2 % ointment Apply topically daily.    SABRA OZEMPIC, 1 MG/DOSE, 4 mg/3 mL SOPN pen Inject 1 mg into the skin once a week at 0900.    . pregabalin (LYRICA) 300 MG capsule Take one capsule (300 mg dose) by mouth 2 (two) times daily.    . pregabalin (LYRICA) 50 mg capsule TAKE 2 CAPSULES BY MOUTH TWICE A DAY (Patient not taking: Reported on 03/16/2023) 120 capsule 0  . rizatriptan (MAXALT) 5 MG tablet Take one tablet (5 mg dose) by mouth 1 (one) time if needed.     No current facility-administered medications on file prior to visit.  [2] Allergies Allergen Reactions  . Morphine And Related Hives, Itching and Swelling  . Tizanidine Confusion and Other    Altered thinking  [3] Social History Socioeconomic History  . Marital status: Married  . Number of children: 3  . Years of education: 97  Occupational History    Comment: unemployeed  Tobacco Use  . Smoking status: Never  . Smokeless tobacco: Never  Vaping Use  . Vaping status: Never Used  Substance and Sexual Activity  . Alcohol use: No  . Drug use: No  . Sexual activity: Yes    Partners: Male    Birth control/protection: Surgical

## 2024-03-28 NOTE — Telephone Encounter (Signed)
 Copied from CRM #[id number]. Topic: Referral - Referrals/Orders [place] >> Mar 28, 2024  1:33 PM Jon NOVAK wrote: Phone - [phone] >> Mar 28, 2024  1:32 PM Jon NOVAK wrote: [facility] is calling other request    Include all details related to the request(s) below: Payton with [facility] called, they need to know reason for referral .   Please call    Confirm and type the Best Contact Number below:  Patient/caller contact number:             [] Home  [] Mobile  [] Work [] Other   [] Okay to leave a voicemail   Medication List:  Current Outpatient Medications:  .  adalimumab (HUMIRA) 40 mg/0.4 mL pnkt injection, Inject 0.4 mL (40 mg total) under the skin every 14 (fourteen) days. (Patient not taking: Reported on 03/18/2024), Disp: 2 Pen, Rfl: 2 .  amLODIPine (NORVASC) 10 mg tablet, Take 1 tablet (10 mg total) by mouth daily., Disp: 90 tablet, Rfl: 3 .  atorvastatin (LIPITOR) 80 mg tablet, TAKE 1 TABLET BY MOUTH EVERYDAY AT BEDTIME, Disp: 90 tablet, Rfl: 0 .  blood-glucose sensor (Dexcom G7 Sensor), Change sensor every 10 days, Disp: 9 each, Rfl: 4 .  ciclopirox (PENLAC) 8 % solution, See Admin Instructions. PLEASE SEE ATTACHED FOR DETAILED DIRECTIONS, Disp: , Rfl:  .  empagliflozin (JARDIANCE) 10 mg tab, Take 1 tablet (10 mg total) by mouth daily., Disp: 30 tablet, Rfl: 6 .  ergocalciferol (VITAMIN D2) 1,250 mcg (50,000 unit) capsule, TAKE 1 CAPSULE BY MOUTH ONE TIME PER WEEK, Disp: 12 capsule, Rfl: 0 .  escitalopram (LEXAPRO) 5 mg tablet, Take 5 mg by mouth daily. (Patient not taking: Reported on 03/18/2024), Disp: , Rfl:  .  ferrous sulfate 325 mg (65 mg iron) tablet, Take 325 mg by mouth daily before breakfast., Disp: , Rfl:  .  hydroCHLOROthiazide (HYDRODIURIL) 25 mg tablet, TAKE 1 TABLET (25 MG TOTAL) BY MOUTH DAILY., Disp: 90 tablet, Rfl: 2 .  hydrOXYzine (ATARAX) 25 mg tablet, TAKE 1 TO 2 TABLETS NIGHTLY AS NEEDED FOR SLEEP, Disp: 180 tablet, Rfl: 1 .  insulin aspart U-100  (NovoLOG Flexpen U-100 Insulin) 100 unit/mL (3 mL) pen, FOR USE IN CASE OF PUMP FAILURE. TDD UP TO 40 UNITS., Disp: 30 mL, Rfl: 1 .  insulin aspart U-100 (NovoLOG) 100 unit/mL injection, USE AS DIRECTED VIA INSULIN PUMP. MAX TDD 40, Disp: 30 mL, Rfl: 5 .  insulin detemir U-100 (Levemir FlexPen) 100 unit/mL (3 mL) pen injection, INJECT 20 UNITS INTO THE SKIN DAILY. IF INSULIN PUMP MALFUNCTION, Disp: 15 mL, Rfl: 5 .  insulin pump cartridge automated dosing (Omnipod 5 G6-G7 Pods, Gen 5,) crtg subcutaneous cartridge, Change pod every 3 days, Disp: 10 each, Rfl: 11 .  INSULIN PUMP CARTRIDGE SUBQ, Inject under the skin. basal, Disp: , Rfl:  .  linaCLOtide (LINZESS) 72 mcg cap capsule, Take one capsule on an empty stomach 30 minutes prior to first meal of the day. Keep medicine in its original container. (Patient taking differently: daily as needed. Take one capsule on an empty stomach 30 minutes prior to first meal of the day. Keep medicine in its original container.), Disp: 90 capsule, Rfl: 4 .  mupirocin (BACTROBAN) 2 % ointment, APPLY TO AFFECTED AREA TWICE A DAY FOR 10 DAYS, Disp: 22 g, Rfl: 0 .  NON FORMULARY, [place]: 1 capsule 3 times daily (Patient not taking: Reported on 03/18/2024), Disp: , Rfl:  .  Omnipod 5 G6-G7 Intro [place], INJECT 1  KIT UNDER THE SKIN ONCE FOR 1 DOSE., Disp: , Rfl:  .  pantoprazole (PROTONIX) 40 mg EC tablet, Take 1 tablet (40 mg total) by mouth every morning before breakfast for 14 days., Disp: 14 tablet, Rfl: 0 .  pregabalin (LYRICA) 300 mg capsule, TAKE 1 CAPSULE BY MOUTH TWICE A DAY, Disp: 60 capsule, Rfl: 1 .  rizatriptan (MAXALT) 5 mg tablet, Take 1 tablet (5 mg total) by mouth once as needed for migraine. May repeat in 2 hours if needed. Do not exceed more than 2 doses in 24hr period., Disp: 20 tablet, Rfl: 0 .  semaglutide (Ozempic) 1 mg/dose (4 mg/3 mL) subcutaneous pen injector, Inject 1 mg under the skin every 7 days., Disp: 3 mL, Rfl: 6 .  traMADoL (ULTRAM) 50  mg tablet, Take 1 tablet (50 mg total) by mouth every 6 (six) hours as needed for moderate pain (4-6). (Patient not taking: Reported on 03/18/2024), Disp: 20 tablet, Rfl: 0     Medication Request/Refills: Pharmacy Information (if applicable)   [] Not Applicable       []  Pharmacy listed  Send Medication Request to:                                                 [] Pharmacy not listed (added to pharmacy list in Epic) Send Medication Request to:      Listed Pharmacies: [id number] [place], [place] - [address] AT [facility] [address] - PHONE: [phone] - FAX: [phone]

## 2024-04-23 ENCOUNTER — Encounter

## 2024-08-12 ENCOUNTER — Ambulatory Visit
